# Patient Record
Sex: Female | Born: 1965 | Race: Black or African American | Hispanic: No | Marital: Single | Smoking: Never smoker
Health system: Southern US, Community
[De-identification: ages and names within clinical notes are randomized; demographics above are authoritative.]

## PROBLEM LIST (undated history)

## (undated) DIAGNOSIS — J9819 Other pulmonary collapse: Secondary | ICD-10-CM

## (undated) DIAGNOSIS — C539 Malignant neoplasm of cervix uteri, unspecified: Secondary | ICD-10-CM

## (undated) DIAGNOSIS — K219 Gastro-esophageal reflux disease without esophagitis: Secondary | ICD-10-CM

## (undated) DIAGNOSIS — D869 Sarcoidosis, unspecified: Secondary | ICD-10-CM

## (undated) DIAGNOSIS — N200 Calculus of kidney: Secondary | ICD-10-CM

## (undated) DIAGNOSIS — I2699 Other pulmonary embolism without acute cor pulmonale: Secondary | ICD-10-CM

## (undated) HISTORY — DX: Gastro-esophageal reflux disease without esophagitis: K21.9

## (undated) HISTORY — DX: Other pulmonary collapse: J98.19

## (undated) HISTORY — DX: Sarcoidosis, unspecified: D86.9

## (undated) HISTORY — PX: CYSTOSCOPY: SUR368

## (undated) HISTORY — DX: Calculus of kidney: N20.0

## (undated) HISTORY — DX: Other pulmonary embolism without acute cor pulmonale: I26.99

## (undated) HISTORY — DX: Malignant neoplasm of cervix uteri, unspecified: C53.9

---

## 1999-06-11 HISTORY — PX: LYMPHADENECTOMY: SHX15

## 1999-10-12 ENCOUNTER — Encounter (INDEPENDENT_AMBULATORY_CARE_PROVIDER_SITE_OTHER): Payer: Self-pay | Admitting: Specialist

## 1999-10-12 ENCOUNTER — Inpatient Hospital Stay (HOSPITAL_COMMUNITY): Admission: AD | Admit: 1999-10-12 | Discharge: 1999-10-12 | Payer: Self-pay

## 1999-10-15 ENCOUNTER — Ambulatory Visit (HOSPITAL_COMMUNITY): Admission: RE | Admit: 1999-10-15 | Discharge: 1999-10-15 | Payer: Self-pay | Admitting: *Deleted

## 1999-10-16 ENCOUNTER — Encounter: Admission: RE | Admit: 1999-10-16 | Discharge: 1999-10-16 | Payer: Self-pay | Admitting: Obstetrics

## 1999-10-23 ENCOUNTER — Ambulatory Visit (HOSPITAL_COMMUNITY): Admission: RE | Admit: 1999-10-23 | Discharge: 1999-10-23 | Payer: Self-pay | Admitting: *Deleted

## 1999-10-23 ENCOUNTER — Encounter (INDEPENDENT_AMBULATORY_CARE_PROVIDER_SITE_OTHER): Payer: Self-pay

## 1999-10-30 ENCOUNTER — Encounter: Admission: RE | Admit: 1999-10-30 | Discharge: 1999-10-30 | Payer: Self-pay | Admitting: Obstetrics & Gynecology

## 1999-11-02 ENCOUNTER — Ambulatory Visit (HOSPITAL_COMMUNITY): Admission: RE | Admit: 1999-11-02 | Discharge: 1999-11-02 | Payer: Self-pay | Admitting: Obstetrics & Gynecology

## 1999-11-21 ENCOUNTER — Ambulatory Visit: Admission: RE | Admit: 1999-11-21 | Discharge: 1999-11-21 | Payer: Self-pay | Admitting: Gynecology

## 1999-11-30 ENCOUNTER — Encounter: Payer: Self-pay | Admitting: Gynecology

## 1999-12-04 ENCOUNTER — Encounter (INDEPENDENT_AMBULATORY_CARE_PROVIDER_SITE_OTHER): Payer: Self-pay

## 1999-12-04 ENCOUNTER — Inpatient Hospital Stay (HOSPITAL_COMMUNITY): Admission: RE | Admit: 1999-12-04 | Discharge: 1999-12-06 | Payer: Self-pay | Admitting: Gynecology

## 1999-12-19 ENCOUNTER — Ambulatory Visit: Admission: RE | Admit: 1999-12-19 | Discharge: 1999-12-19 | Payer: Self-pay | Admitting: Gynecology

## 1999-12-21 ENCOUNTER — Encounter: Admission: RE | Admit: 1999-12-21 | Discharge: 2000-03-20 | Payer: Self-pay | Admitting: Radiation Oncology

## 2000-01-29 ENCOUNTER — Ambulatory Visit: Admission: RE | Admit: 2000-01-29 | Discharge: 2000-01-29 | Payer: Self-pay | Admitting: Gynecologic Oncology

## 2000-02-02 ENCOUNTER — Emergency Department (HOSPITAL_COMMUNITY): Admission: EM | Admit: 2000-02-02 | Discharge: 2000-02-02 | Payer: Self-pay | Admitting: Emergency Medicine

## 2000-02-12 ENCOUNTER — Inpatient Hospital Stay (HOSPITAL_COMMUNITY): Admission: RE | Admit: 2000-02-12 | Discharge: 2000-02-15 | Payer: Self-pay | Admitting: Radiation Oncology

## 2000-05-27 ENCOUNTER — Ambulatory Visit: Admission: RE | Admit: 2000-05-27 | Discharge: 2000-05-27 | Payer: Self-pay | Admitting: Gynecology

## 2000-05-27 ENCOUNTER — Other Ambulatory Visit: Admission: RE | Admit: 2000-05-27 | Discharge: 2000-05-27 | Payer: Self-pay | Admitting: Gynecology

## 2000-09-02 ENCOUNTER — Other Ambulatory Visit: Admission: RE | Admit: 2000-09-02 | Discharge: 2000-09-02 | Payer: Self-pay | Admitting: Radiation Oncology

## 2000-11-18 ENCOUNTER — Other Ambulatory Visit: Admission: RE | Admit: 2000-11-18 | Discharge: 2000-11-18 | Payer: Self-pay | Admitting: Gynecology

## 2000-11-18 ENCOUNTER — Ambulatory Visit: Admission: RE | Admit: 2000-11-18 | Discharge: 2000-11-18 | Payer: Self-pay | Admitting: Gynecology

## 2001-03-10 ENCOUNTER — Other Ambulatory Visit: Admission: RE | Admit: 2001-03-10 | Discharge: 2001-03-10 | Payer: Self-pay | Admitting: Radiation Oncology

## 2001-07-29 ENCOUNTER — Other Ambulatory Visit: Admission: RE | Admit: 2001-07-29 | Discharge: 2001-07-29 | Payer: Self-pay | Admitting: Gynecology

## 2001-07-29 ENCOUNTER — Ambulatory Visit: Admission: RE | Admit: 2001-07-29 | Discharge: 2001-07-29 | Payer: Self-pay | Admitting: Gynecology

## 2001-07-29 ENCOUNTER — Encounter (INDEPENDENT_AMBULATORY_CARE_PROVIDER_SITE_OTHER): Payer: Self-pay

## 2001-10-20 ENCOUNTER — Other Ambulatory Visit: Admission: RE | Admit: 2001-10-20 | Discharge: 2001-10-20 | Payer: Self-pay | Admitting: Radiation Oncology

## 2001-12-01 ENCOUNTER — Emergency Department (HOSPITAL_COMMUNITY): Admission: EM | Admit: 2001-12-01 | Discharge: 2001-12-02 | Payer: Self-pay | Admitting: Emergency Medicine

## 2002-04-26 ENCOUNTER — Encounter: Admission: RE | Admit: 2002-04-26 | Discharge: 2002-04-26 | Payer: Self-pay | Admitting: Gastroenterology

## 2002-04-26 ENCOUNTER — Encounter: Payer: Self-pay | Admitting: Gastroenterology

## 2002-05-04 ENCOUNTER — Other Ambulatory Visit: Admission: RE | Admit: 2002-05-04 | Discharge: 2002-05-04 | Payer: Self-pay | Admitting: Gynecology

## 2002-05-04 ENCOUNTER — Encounter (INDEPENDENT_AMBULATORY_CARE_PROVIDER_SITE_OTHER): Payer: Self-pay | Admitting: *Deleted

## 2002-05-04 ENCOUNTER — Ambulatory Visit: Admission: RE | Admit: 2002-05-04 | Discharge: 2002-05-04 | Payer: Self-pay | Admitting: Gynecology

## 2002-10-19 ENCOUNTER — Emergency Department (HOSPITAL_COMMUNITY): Admission: EM | Admit: 2002-10-19 | Discharge: 2002-10-19 | Payer: Self-pay | Admitting: Emergency Medicine

## 2002-10-19 ENCOUNTER — Encounter: Payer: Self-pay | Admitting: Emergency Medicine

## 2002-11-02 ENCOUNTER — Ambulatory Visit (HOSPITAL_COMMUNITY): Admission: RE | Admit: 2002-11-02 | Discharge: 2002-11-02 | Payer: Self-pay | Admitting: Radiation Oncology

## 2002-11-02 ENCOUNTER — Ambulatory Visit: Admission: RE | Admit: 2002-11-02 | Discharge: 2002-11-02 | Payer: Self-pay | Admitting: Radiation Oncology

## 2002-11-02 ENCOUNTER — Other Ambulatory Visit: Admission: RE | Admit: 2002-11-02 | Discharge: 2002-11-02 | Payer: Self-pay | Admitting: Radiation Oncology

## 2003-03-06 ENCOUNTER — Emergency Department (HOSPITAL_COMMUNITY): Admission: EM | Admit: 2003-03-06 | Discharge: 2003-03-06 | Payer: Self-pay | Admitting: Emergency Medicine

## 2003-04-15 ENCOUNTER — Ambulatory Visit (HOSPITAL_COMMUNITY): Admission: RE | Admit: 2003-04-15 | Discharge: 2003-04-15 | Payer: Self-pay | Admitting: Gastroenterology

## 2003-04-15 ENCOUNTER — Encounter (INDEPENDENT_AMBULATORY_CARE_PROVIDER_SITE_OTHER): Payer: Self-pay | Admitting: *Deleted

## 2003-07-19 ENCOUNTER — Ambulatory Visit: Admission: RE | Admit: 2003-07-19 | Discharge: 2003-07-19 | Payer: Self-pay | Admitting: Gynecology

## 2003-07-19 ENCOUNTER — Other Ambulatory Visit: Admission: RE | Admit: 2003-07-19 | Discharge: 2003-07-19 | Payer: Self-pay | Admitting: Gynecology

## 2003-11-08 ENCOUNTER — Other Ambulatory Visit: Admission: RE | Admit: 2003-11-08 | Discharge: 2003-11-08 | Payer: Self-pay | Admitting: Gynecology

## 2003-11-08 ENCOUNTER — Ambulatory Visit: Admission: RE | Admit: 2003-11-08 | Discharge: 2003-11-08 | Payer: Self-pay | Admitting: Gynecology

## 2003-11-08 ENCOUNTER — Ambulatory Visit: Admission: RE | Admit: 2003-11-08 | Discharge: 2003-11-08 | Payer: Self-pay | Admitting: Radiation Oncology

## 2003-11-08 ENCOUNTER — Encounter (INDEPENDENT_AMBULATORY_CARE_PROVIDER_SITE_OTHER): Payer: Self-pay | Admitting: Specialist

## 2004-06-10 HISTORY — PX: NASAL SINUS SURGERY: SHX719

## 2004-06-11 ENCOUNTER — Emergency Department (HOSPITAL_COMMUNITY): Admission: EM | Admit: 2004-06-11 | Discharge: 2004-06-12 | Payer: Self-pay | Admitting: Emergency Medicine

## 2004-08-03 ENCOUNTER — Ambulatory Visit (HOSPITAL_COMMUNITY): Admission: RE | Admit: 2004-08-03 | Discharge: 2004-08-03 | Payer: Self-pay | Admitting: *Deleted

## 2004-08-03 ENCOUNTER — Ambulatory Visit (HOSPITAL_BASED_OUTPATIENT_CLINIC_OR_DEPARTMENT_OTHER): Admission: RE | Admit: 2004-08-03 | Discharge: 2004-08-03 | Payer: Self-pay | Admitting: *Deleted

## 2004-08-03 ENCOUNTER — Encounter (INDEPENDENT_AMBULATORY_CARE_PROVIDER_SITE_OTHER): Payer: Self-pay | Admitting: *Deleted

## 2004-08-10 ENCOUNTER — Other Ambulatory Visit: Admission: RE | Admit: 2004-08-10 | Discharge: 2004-08-10 | Payer: Self-pay | Admitting: Gynecology

## 2004-08-10 ENCOUNTER — Ambulatory Visit: Admission: RE | Admit: 2004-08-10 | Discharge: 2004-08-10 | Payer: Self-pay | Admitting: Gynecology

## 2004-08-28 ENCOUNTER — Ambulatory Visit (HOSPITAL_COMMUNITY): Admission: RE | Admit: 2004-08-28 | Discharge: 2004-08-28 | Payer: Self-pay | Admitting: Gynecology

## 2005-10-23 ENCOUNTER — Ambulatory Visit: Payer: Self-pay | Admitting: *Deleted

## 2006-05-27 ENCOUNTER — Encounter: Admission: RE | Admit: 2006-05-27 | Discharge: 2006-05-27 | Payer: Self-pay | Admitting: Gastroenterology

## 2006-06-14 ENCOUNTER — Emergency Department (HOSPITAL_COMMUNITY): Admission: EM | Admit: 2006-06-14 | Discharge: 2006-06-15 | Payer: Self-pay | Admitting: Emergency Medicine

## 2006-06-19 ENCOUNTER — Encounter: Admission: RE | Admit: 2006-06-19 | Discharge: 2006-06-19 | Payer: Self-pay | Admitting: Internal Medicine

## 2006-06-23 ENCOUNTER — Encounter: Admission: RE | Admit: 2006-06-23 | Discharge: 2006-06-23 | Payer: Self-pay | Admitting: Family Medicine

## 2006-08-19 ENCOUNTER — Ambulatory Visit (HOSPITAL_COMMUNITY): Admission: RE | Admit: 2006-08-19 | Discharge: 2006-08-19 | Payer: Self-pay | Admitting: Gastroenterology

## 2006-08-19 ENCOUNTER — Encounter (INDEPENDENT_AMBULATORY_CARE_PROVIDER_SITE_OTHER): Payer: Self-pay | Admitting: Specialist

## 2006-09-09 ENCOUNTER — Emergency Department (HOSPITAL_COMMUNITY): Admission: EM | Admit: 2006-09-09 | Discharge: 2006-09-09 | Payer: Self-pay | Admitting: Emergency Medicine

## 2007-12-29 ENCOUNTER — Ambulatory Visit (HOSPITAL_COMMUNITY): Admission: RE | Admit: 2007-12-29 | Discharge: 2007-12-29 | Payer: Self-pay | Admitting: Family Medicine

## 2008-09-14 ENCOUNTER — Ambulatory Visit (HOSPITAL_COMMUNITY): Admission: RE | Admit: 2008-09-14 | Discharge: 2008-09-14 | Payer: Self-pay | Admitting: Obstetrics & Gynecology

## 2008-12-30 ENCOUNTER — Ambulatory Visit (HOSPITAL_COMMUNITY): Admission: RE | Admit: 2008-12-30 | Discharge: 2008-12-30 | Payer: Self-pay | Admitting: Family Medicine

## 2010-01-05 ENCOUNTER — Ambulatory Visit (HOSPITAL_COMMUNITY): Admission: RE | Admit: 2010-01-05 | Discharge: 2010-01-05 | Payer: Self-pay | Admitting: Obstetrics & Gynecology

## 2010-04-19 ENCOUNTER — Observation Stay (HOSPITAL_COMMUNITY): Admission: EM | Admit: 2010-04-19 | Discharge: 2010-04-19 | Payer: Self-pay | Admitting: Emergency Medicine

## 2010-04-19 ENCOUNTER — Encounter (INDEPENDENT_AMBULATORY_CARE_PROVIDER_SITE_OTHER): Payer: Self-pay | Admitting: Emergency Medicine

## 2010-06-10 DIAGNOSIS — J9819 Other pulmonary collapse: Secondary | ICD-10-CM

## 2010-06-10 HISTORY — DX: Other pulmonary collapse: J98.19

## 2010-07-01 ENCOUNTER — Encounter: Payer: Self-pay | Admitting: Gynecology

## 2010-08-21 LAB — POCT I-STAT, CHEM 8
Calcium, Ion: 1.27 mmol/L (ref 1.12–1.32)
Chloride: 102 mEq/L (ref 96–112)
Hemoglobin: 11.9 g/dL — ABNORMAL LOW (ref 12.0–15.0)
Sodium: 137 mEq/L (ref 135–145)

## 2010-10-26 NOTE — Op Note (Signed)
NAME:  [name] [name], JENTSCH                      ACCOUNT NO.:  [id number]   MEDICAL RECORD NO.:  [id number]                   PATIENT TYPE:  AMB   LOCATION:  ENDO                                 FACILITY:  [facility]   PHYSICIAN:  Petra Kuba, M.D.                 DATE OF BIRTH:  1965/10/25   DATE OF PROCEDURE:  04/15/2003  DATE OF DISCHARGE:                                 OPERATIVE REPORT   PROCEDURE:  Colonoscopy with biopsy and ERBE argon tissue plasma coagulator.   INDICATIONS FOR PROCEDURE:  Bright red blood per rectum and mucus in a  patient status post cervical cancer with radiation implants, longstanding.  Consent was signed  after the risks and benefits, methods and options were  thoroughly discussed in the office.   MEDICATIONS:  Demerol 30, Versed 3.   DESCRIPTION OF PROCEDURE:  A rectal inspection was pertinent for small  external hemorrhoids. The digital examination  was negative. A video  pediatric adjustable colonoscope was inserted and some mild radiation  proctitis was confirmed with the customary telangiectasias.  Photodocumentation was obtained.   With abdominal  pressure we were able to advance around the colon to the  cecum. This did not require any position changes. No other  obvious  abnormality was seen. We readvanced to the cecum. The cecum was identified  by the appendiceal orifice and ileocecal valve; in fact the scope was  inserted a short way into the terminal ileum which was normal.  Photodocumentation was obtained.   The scope was slowly withdrawn. She did have a tortuous left side of the  colon, but on slow withdrawal through the colon, no abnormalities were seen  until we withdrew back to the rectum. In the more proximal rectum away from  the radiation changes was a tiny polyp  which was cold biopsied x1. The  radiation distal  proctitis was evaluated and it was washed and watched, and  based on her persistent bleeding, we went ahead and very  carefully and only  minimally used the ERBE argon tissue  plasma coagulator with good  coagulation of the telangiectatic areas.   Prior to doing this we tried a few times to retroflex, but based on the  scarring and fixation from the radiation, it could not easily be done, so  the scope was not retroflexed. Anorectal pullthrough did not reveal any  additional findings but maybe some small  hemorrhoids.   Once the ERBE argon tissue plasma coagulator was done, the scope was  reinserted a short way up the left side  of the colon. Air was suctioned and  the scope was removed. The patient tolerated the procedure well. There was  no obvious immediate complication.   ENDOSCOPIC DIAGNOSES:  1. Minimal radiation proctitis, status post  ERBE argon tissue  plasma     coagulator, minimally used.  2. Tiny rectal proximal polyp, not near the radiation area, status  post     cold biopsy.  3. Otherwise  within normal limits to the terminal ileum.    PLAN:  Await pathology to determine further  colonic screening. Follow up in  2 months or p.r.n. to recheck symptoms and make sure no further  workup  plans are needed or more aggressive cauterization in the future.                                               Petra Kuba, M.D.    MEM/MEDQ  D:  04/15/2003  T:  04/15/2003  Job:  [id number]   cc:   De Blanch, M.D.   Billie Lade, M.D.  [address] - [facility]  [place]  [place]  [address]  Fax: [phone]

## 2010-10-26 NOTE — Discharge Summary (Signed)
[facility]  Patient:    [name] [name], [name] [name]                   MRN: [id number] Adm. Date:  [id number] Disc. Date: [id number] Attending:  Jeannette Corpus CC:         Billie Lade, M.D.             Telford Nab, N.P.             Bing Neighbors. Clearance Coots, M.D.                           Discharge Summary  HOSPITAL COURSE:  The patient was taken to the operating room following admission where she was explored for consideration of a radical hysterectomy. Unfortunately she was found to have a grossly enlarged right obturator lymph node which was extensively replaced by metastatic carcinoma.  The radical hysterectomy was aborted, and pelvic and periaortic lymphadenectomy was performed, in order to allow for complete surgical staging.  Postoperatively the patient did well.  She had a rapid advanced in her diet, and had good pain control on oral pain medication.  Her postoperative hematocrit was 32.6%.  DISPOSITION:  The patient was discharged home on December 06, 1999, to return to see Dr. Reuel Boom L. Clarke-Pearson for staple removal on December 10, 1999.  To return on uly 11, 2001, for treatment planning.  At this juncture we anticipate treatment with radiation therapy and concurrent weekly cisplatin chemotherapy.  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  DISCHARGE INSTRUCTIONS:  The patient can gradually return to normal activity. he should eat a regular diet.  She was given a prescription for Vicodin ES one q.4-6h. p.r.n. pain.  She may use a gentle laxative if she needs to.  FOLLOWUP:  She will return to see Dr. Reuel Boom L. Clarke-Pearson on December 19, 1999, for treatment planning.  CONDITION ON DISCHARGE:  Improved. DD:  12/11/99 TD:  12/11/99 Job: [id number]

## 2010-10-26 NOTE — Op Note (Signed)
NAMEMarland Kitchen  CHARNISE, LOVAN            ACCOUNT NO.:  [id number]   MEDICAL RECORD NO.:  [id number]          PATIENT TYPE:  AMB   LOCATION:  ENDO                         FACILITY:  [facility]   PHYSICIAN:  Petra Kuba, M.D.    DATE OF BIRTH:  11-14-1965   DATE OF PROCEDURE:  08/19/2006  DATE OF DISCHARGE:  08/19/2006                               OPERATIVE REPORT   PROCEDURE:  Colonoscopy.   INDICATION:  Change of bowel habit, increased constipation, bright red  blood per rectum.  Consent was signed after risks, benefits, methods,  and options thoroughly discussed multiple times in the past.   MEDICINES USED:  Fentanyl 50 mcg, Versed 9 mg.   PROCEDURE:  Rectal inspections pertinent for small external hemorrhoids.  Digital exam was negative.  The video pediatric adjustable colonoscopy  was inserted.  There was some difficulty due to a long looping tortuous  colon.  With rolling her on her back and various abdominal pressures,  was able to be advanced to the cecum.  Other than some minimal radiation  proctitis, no abnormalities were seen on insertion.  The scope was  inserted a short way into the terminal ileum which was normal.  Photo  documentation was obtained.  The cecum was identified by the appendiceal  orifice and ileocecal valve.  The scope was slowly withdrawn.  Prep was  adequate.  There was some liquid stool that required washing and  suctioning.  No abnormalities were seen as we slowly withdrew back to  the rectum.  Once back in the rectum, anorectal pull through and  retroflexion confirmed the minimal radiation proctitis without any other  lesions.  The scope was inserted a short way up the left side of the  colon.  Air was suctioned.  The scope removed.  The patient tolerated  the procedure well.  There was no obvious immediate complication  endoscopic.   DIAGNOSES:  1. Internal and external small hemorrhoids.  2. Minimal radiation proctitis.  3. Otherwise within normal  limits to the cecum and terminal ileum.   PLAN:  Continue __________ suppository since that is clearly helping.  Maybe try Amitiza next for constipation.  Continue workup with  endoscopy.  Please see that dictation for the recommendation, workup and  plan.           ______________________________  Petra Kuba, M.D.     MEM/MEDQ  D:  08/19/2006  T:  08/21/2006  Job:  [id number]   cc:   Tanya D. Daphine Deutscher, M.D.  De Blanch, M.D.

## 2010-10-26 NOTE — Discharge Summary (Signed)
[facility]  Patient:    [name], [name]                   MRN: [id number] Adm. Date:  [id number] Disc. Date: [id number] Attending:  Jeneen Montgomery CC:         Rande Brunt. Clarke-Pearson, M.D.  Valentino Hue. Magrinat, M.D.  Roseanna Rainbow, M.D.   Discharge Summary  DISCHARGE DIAGNOSIS:  Stage IB squamous cell carcinoma of the cervix with positive pelvic nodes at exploratory laparotomy.  CHIEF COMPLAINT:  Radiation treatment with intracavitary radiation using cesium-137 radiation sources.  DISCHARGE MEDICATIONS:  Tylenol #3, dispense #25, one q.6h. p.r.n. pain p.o.  DISCHARGE INSTRUCTIONS:  Call for a temperature of greater than 101.5 degrees, or for severe vaginal bleeding.  FOLLOWUP:  In radiation oncology in one month.  HOSPITAL COURSE:  [name] [name] is a 45 year old female who was diagnosed with clinical stage IB1 squamous cell carcinoma of the cervix.  She decided on a radical hysterectomy as her definitive treatment.  At the time of her exploratory laparotomy, the patient was noted to have enlarged pelvic nodes, which were biopsied and returned as squamous cell carcinoma.  The patient proceeded to undergo pelvic and periaortic lymphadenectomy.  In light of the positive nodes, the patients radical hysterectomy was aborted.  She was subsequently referred to radiation oncology, and recently completed 4500 cGy to the pelvis area.  On February 12, 2000, the patient was taken to the operating room and on examination under anesthesia the patient was noted to have significant improvement in her cervical lesion.  The tumor mass had decreased in size to approximately 2.0 cm x 2.5 cm in size.  She subsequently had her Fletcher-Suit applicator placed under anesthesia.  On the afternoon of February 12, 2000, the patient had loading of her Fletcher-Suit applicator, with 5, 15, 10 mg sources in the tandem.  The ovoids were loaded each with  10 mg sources.  The implant duration was for 63 hours, which began on Tuesday, February 12, 2000, at 3:45 p.m.  The radiation sources were subsequently removed at 6:45 a.m. on February 15, 2000.  The radiation survey was completed, showing no retained sources within the patient, hospital bed, or room.  DISPOSITION:  The patient was subsequently discharged home later in the morning, after her Betadine douche and Fleets enema.  FOLLOWUP:  She is to return for followup in radiation/oncology in one month. DD:  02/15/00 TD:  02/15/00 Job: [id number]

## 2010-10-26 NOTE — Op Note (Signed)
NAME:  [name] [name], [name] [name]            ACCOUNT NO.:  [id number]   MEDICAL RECORD NO.:  [id number]          PATIENT TYPE:  AMB   LOCATION:  DSC                          FACILITY:  [facility]   PHYSICIAN:  Kathy Breach, M.D.      DATE OF BIRTH:  05-Jan-1966   DATE OF PROCEDURE:  08/03/2004  DATE OF DISCHARGE:                                 OPERATIVE REPORT   PREOPERATIVE DIAGNOSIS:  Obstructive, hyperplastic inferior nasal  turbinates.   OPERATIVE PROCEDURE:  Bilateral submucous resection, inferior turbinates.   POSTOPERATIVE DIAGNOSIS:  Obstructive, hyperplastic inferior nasal  turbinates.   DESCRIPTION OF PROCEDURE:  With the patient under general orotracheal  anesthesia, nasal inspection revealed obstructive, congested inferior  turbinates bilaterally.  Nasal block anesthesia was applied with olive-  tipped probe soaked in 4% Xylocaine/ephedrine solution to the sphenopalatine  and anterior ethmoid nerve areas bilaterally.  Cotton pledgets inserted  along the inferior turbinates soaked in similar solution.  Both inferior  turbinates were then infiltrated with 1% Xylocaine and 1:100,000 epinephrine  for further vasoconstrictive effort.  The patient's nose was decongested  nicely.  Large, prominent, heavy turbinate bones evident to inspection after  decongesting.  A stab incision was made over the anterior aspect of the left  inferior turbinate.  Superiorly-based septal-facing mucosal flap elevated  and incision made along the inferior aspect.  The lower 50% or so of the  turbinate bone was attached free margin, and the inferior meatal mucosa was  excised with angled scissors.  Hemostasis along the mucosal and bone line  obtained with suction cautery.  The remaining turbinate bone was gently  outfractured.  A similar procedure was done on the right side.  On the left  side, the posterior extent was ablated primarily with suction cautery.  On  the right side it was excised.  Blood loss for the  procedure was less than  50 mL.  The patient tolerated the procedure well, was taken to the recovery  room in stable general condition.      JGL/MEDQ  D:  08/03/2004  T:  08/03/2004  Job:  [id number]

## 2010-10-26 NOTE — Consult Note (Signed)
NAME:  [name] [name], [name] [name]            ACCOUNT NO.:  [id number]   MEDICAL RECORD NO.:  [id number]          PATIENT TYPE:  OUT   LOCATION:  GYN                          [facility]   PHYSICIAN:  De Blanch, M.D.DATE OF BIRTH:  [birth year]   DATE OF CONSULTATION:  08/10/2004  DATE OF DISCHARGE:                                   CONSULTATION   A 45 year old African-American female returns for continuing followup of  cervical cancer.   INTERVAL HISTORY:  Since her last visit, the patient has done well. She  denies any GI or GU symptoms and has no pelvic pain, pressure, vaginal bleed  or discharge. She recently underwent sinus surgery and seems to be  recovering well from that.   HISTORY OF PRESENT ILLNESS:  The patient is found to have a stage 1B  squamous cell carcinoma of the cervix in June of 2001. She underwent  exploratory laparotomy in consideration for radical hysterectomy which was  aborted because she had bilaterally enlarged pelvic lymph nodes. She was  subsequently treated with radiation therapy and concurrent cisplatin  chemotherapy. She had a complete response and has been followed since then  with no evidence of recurrent disease.   PAST MEDICAL HISTORY:  Medical illnesses none.   ALLERGIES:  None.   PAST SURGICAL HISTORY:  Exploratory laparotomy.   FAMILY HISTORY:  Negative for gynecologic, breast or colon cancer.   SOCIAL HISTORY:  The patient does not smoke.   REVIEW OF SYMPTOMS:  Negative except as noted above.   PHYSICAL EXAMINATION:  VITAL SIGNS:  Weight 135 pounds, blood pressure  120/80.  GENERAL:  The patient is a healthy black female in no acute distress.  HEENT:  Negative.  NECK:  Supple without thyromegaly. There is no supraclavicular or inguinal  adenopathy.  ABDOMEN:  Soft, nontender, no mass, organomegaly, ascites or hernias are  noted.  PELVIC:  EGBUS, vagina, bladder, urethra are normal and well supported. No  lesions are noted. The  cervix and uterus are not present. Bimanual reveals  no masses, induration or nodularity. Rectovaginal exam confirms.   IMPRESSION:  Stage 1B squamous cell carcinoma of the cervix with bilateral  metastatic disease in the pelvis status post radiation therapy and  concurrent chemotherapy. No evidence of recurrent disease. Pap smears are  repeated. The patient will have a baseline mammogram in the near future. She  will return to see [place] in one year for continuing surveillance.      DC/MEDQ  D:  08/10/2004  T:  08/10/2004  Job:  [id number]   cc:   Billie Lade, M.D.  [address] - [facility]  [place]  [place] [address]  Fax: [phone]   Valentino Hue. Magrinat, M.D.  [address] [facility]  [place]  [place] [address]  Fax: [phone]   Maryla Morrow. Modesto Charon, M.D.  [address]  [place]  [place] [address]  Fax: [phone]   Telford Nab, R.N.  [phone]  [place], [place] [address]

## 2010-10-26 NOTE — Op Note (Signed)
[facility]  Patient:    [name] [name], [name] [name]                   MRN: [id number] Proc. Date: 12/04/99 Adm. Date:  [id number] Disc. Date: [id number] Attending:  Jeannette Corpus CC:         Bing Neighbors. Clearance Coots, M.D.             Telford Nab, N.P.             Roseanna Rainbow, M.D.                           Operative Report  PREOPERATIVE DIAGNOSIS:  Stage IB1 squamous cell carcinoma of the cervix.  POSTOPERATIVE DIAGNOSIS:  Stage IB1 squamous cell carcinoma of the cervix, with  metastasis to the right obturator lymph node.  SURGICAL PROCEDURE:  Exploratory laparotomy, pelvic lymphadenectomy, periaortic  lymphadenectomy, transposition of the right ovary.  SURGEON:  Daniel L. Clarke-Pearson, M.D.  ASSISTANT:  Bing Neighbors. Clearance Coots, M.D. and Telford Nab, N.P.  ANESTHESIA:  General with orotracheal tube.  ESTIMATED BLOOD LOSS:  300 cc.  SURGICAL FINDINGS:  At exploratory laparotomy there was a 5.0 cm x 6.0 cm right  obturator lymph node which was wrapped around the right obturator nerve.  This as completely resected.  There were no other suspicious lymph nodes in the external iliac or other side of the pelvis.  The periaortic lymph nodes were likewise normal in size.  There was no evidence of any intraperitoneal disease.  The parametria  felt free of any disease.  DESCRIPTION OF PROCEDURE:  The patient was brought to the operating room and after satisfactory attainment of general anesthesia, was placed in the modified lithotomy position in the [place] stirrups.  The anterior abdominal wall, perineum, and vagina were prepped with Betadine.  A Foley catheter was placed and the patient was draped.  The abdomen was entered through a Pfannenstiel incision, and the abdomen and pelvis explored with the above-noted findings.  The Bookwalter retractor was assembled and the small bowel was packed out of the pelvis.  The right round ligament  was divided and the retroperitoneal space opened.  The paravesical and  pararectal spaces were opened.  At this juncture the large obturator lymph node was discovered and it was determined that this should be resected as the first step of the operation.  Using sharp and blunt dissection, the obturator node was gently and carefully mobilized from the undersurface of the right external iliac vein.  Once this was accomplished, the obturator fossa was developed further.  The node was stuck to the obturator internus muscle, but this was freed with sharp and blunt dissection. The obturator nerve was identified distally and using sharp and blunt dissection, was mobilized cephalad.  Approximately halfway up the obturator nerve, it was found to be encased in the tumor mass.  The hypogastric vein was identified and with careful dissection, dissected free from the mass. The mass was further dissected from the pelvic floor and obturator internus muscle.  Finally the mass was dissected free from the obturator nerve and submitted to pathology.  Frozen section revealed this to be a large metastatic deposit in the lymph node.  The left side of the pelvis was opened, identifying the paravesical and pararectal spaces.  Palpation of the Cardinal ligaments and side wall revealed no evidence of grossly enlarged lymph nodes.  The lymph nodes were removed from the obturator  space on the left side.  Hemostasis achieved with hemoclips.  Attention was turned to the aortic chain, in order to stage the patient more properly.  A peritoneal incision was made overlying the right common iliac artery and along the aorta.  The lymph nodes over the right common iliac and aorta and  vena cava were resected, using hemoclips for hemostasis.  These were sent to frozen section and returned as negative.  The right ovary and tube were detached from their uterine-ovarian anastomosis and mobilized into the right  pericolic gutter lateral to the cecum.  The pedicle was transfixed and suture ligated using #2-0 Vicryl.  The pedicle was then marked with two large hemoclips.  The ovary was sutured to the right pericolic gutter in order to mobilize it away from the radiation field.  The pelvis was reinspected and irrigated.  Hemostasis was adequate.  It is felt at this juncture that the patient would be best served by giving a combination of radiation therapy and chemotherapy, and that a radical hysterectomy would only add to increased morbidity, and also  would eliminate the opportunity for intercavity cesium application.  Therefore he radical hysterectomy portion of the operation was abandoned.  The packs and retractors were removed.  The anterior abdominal wall was closed in layers, the  first being a running suture of #2-0 Vicryl on the peritoneum.  The fascia was closed with #0 PDS.  The subcutaneous tissue was irrigated and hemostasis achieved with cautery, and the skin closed with skin staples.  The patient was awakened from anesthesia and taken to the recovery room after a  dressing was applied.  The sponge, needle, and instrument counts were correct x 2.DD:  12/04/99 TD:  12/04/99 Job: [id number]

## 2010-10-26 NOTE — Consult Note (Signed)
[facility]  Patient:    [name] [name], [name] [name] Visit Number: [id number] MRN: [id number]          Service Type: GON Location: GYN Attending Physician:  Jeannette Corpus Dictated by:   Rande Brunt. Clarke-Pearson, M.D.   CC:         Billie Lade, M.D.  Valentino Hue. Magrinat, M.D.  Redmond Baseman, M.D.  Telford Nab, R.N.   Consultation Report  REASON FOR CONSULTATION:  This is a 45 year old African-American female who returns for continuing followup. She had a clinical stage IB cervical carcinoma and was explored for radial hysterectomy. She had bilaterally positive pelvic nodes but negative periaortic nodes. The radial hysterectomy was aborted, and the patient underwent primary radiation therapy with intracavitary cesium application combined with weekly cisplatin chemotherapy. Since her last visit, she has done very well. She is working full time as a Investment banker, operational at [employer]. She denies any GI or GU symptoms; has no pelvic pain, pressure, vaginal bleeding, or discharge.  REVIEW OF SYSTEMS:  Negative except as noted above.  FAMILY HISTORY/SOCIAL HISTORY:  Reviewed, and the patient does not smoke.  PHYSICAL EXAMINATION:  VITAL SIGNS:  Weight 130 pounds. Blood pressure 110/68.  GENERAL:  The patient is a healthy African-American female in no acute distress.  HEENT:  Negative.  NECK:  Supple without thyromegaly. There is no supraclavicular or inguinal adenopathy.  ABDOMEN:  Soft, nontender. No masses, organomegaly, ascites, or hernias are noted. Her incision is well-healed.  PELVIC:  EG/BUS normal. Vagina is clean, well-supported. No lesions are noted.  BIMANUAL RECTOVAGINAL:  Reveal no masses, induration, or nodularity.  EXTREMITIES:  Lower extremities are without edema or varicosities.  IMPRESSION:  Stage IB squamous cell carcinoma of the cervix with bilateral positive pelvic nodes status post radiation therapy with  concurrent cisplatin chemotherapy. She remains clinically free of disease.  PLAN:  Pap smears are obtained today. The patient will return to see Dr. Roselind Messier in 3 months and return to see [place] in September 2003. Dictated by:   Rande Brunt. Clarke-Pearson, M.D. Attending Physician:  Jeannette Corpus DD:  07/29/01 TD:  07/30/01 Job: [id number]

## 2010-10-26 NOTE — Op Note (Signed)
NAMEMarland Kitchen  [name] [name], [name] [name]            ACCOUNT NO.:  [id number]   MEDICAL RECORD NO.:  [id number]          PATIENT TYPE:  AMB   LOCATION:  ENDO                         FACILITY:  [facility]   PHYSICIAN:  Petra Kuba, M.D.    DATE OF BIRTH:  July 03, 1965   DATE OF PROCEDURE:  08/19/2006  DATE OF DISCHARGE:  08/19/2006                               OPERATIVE REPORT   PROCEDURE:  Esophagogastroduodenoscopy with biopsy.   INDICATION:  The patient with weight loss, decreased appetite.   Consent was signed after risks, benefits, methods, options thoroughly  discussed in the office and prior to any premeds given.  Additional  medicines for this procedure is 1 of Versed only.   PROCEDURE:  The video endoscope was inserted by direct vision.  The  esophagus was normal.  There may have been a tiny hiatal hernia.  The  scope passed into the stomach and advanced to the antrum and advanced  through a normal pylorus.  There was minimal inflammation in the  duodenal bulb and around the celiac to a normal second portion of the  duodenum.  The scope was withdrawn back to the bulb and a good look  there ruled out abnormality and location.  The scope was withdrawn back  to the stomach and retroflexed.  Along the angularis and along the  distal greater curve was some increased inflammation and a few tiny  erosions.  The cardia, fundus, lesser and greater curve proximally were  normal on retroflexed visualization.  Straight visualization of the  stomach did not reveal any additional findings but the gastritis.  We  went ahead and took a few biopsies of the inflammation and a few of the  erosions, and a few of the proximal stomach, as well, to rule out any  Helicobacter.  Air was suctioned.  The scope slowly withdrawn.  Again,  no other abnormalities were seen on straight visualization of the  stomach.  On slow withdrawal, a good look at the esophagus was normal.  The scope was removed.  The patient tolerated the  procedure well.  There  was no obvious immediate complication.   ENDOSCOPIC DIAGNOSES:  1. Questionable tiny hiatal hernia.  2. Gastritis and few distal greater curve and angularis erosions,      status post biopsy.  3. Otherwise normal esophagogastroduodenoscopy except for some minimal      bulbitis.   PLAN:  Care with aspirate and nonsteroidals.  Await pathology.  Prilosec  OTC one time a day.  Followup in one month to recheck symptoms and  decide any other workup and plans.  Will call and check symptoms when we  review biopsies and maybe give her samples of Amitiza at that juncture.           ______________________________  Petra Kuba, M.D.     MEM/MEDQ  D:  08/19/2006  T:  08/21/2006  Job:  [id number]   cc:   Tanya D. Daphine Deutscher, M.D.  De Blanch, M.D.

## 2010-10-26 NOTE — Consult Note (Signed)
[facility]  Patient:    [name] [name], [name] [name]                   MRN: [id number] Proc. Date: 01/29/00 Adm. Date:  [id number] Disc. Date: [id number] Attending:  Jeannette Corpus CC:         Redmond Baseman, M.D. - Valentino Hue. Magrinat, M.D.             Billie Lade, M.D. - Roseanna Rainbow, M.D.             Telford Nab, R.N.                          Consultation Report  CHIEF COMPLAINT:  Ms. Climer returns for ongoing followup during chemotherapy, irradiation for stage IB cancer of the cervix, with lymph node metastasis.  HISTORY OF PRESENT ILLNESS:  The patient had a prior diagnosis of cervical dysplasia treated with cryotherapy in 1999.  She had a biopsy on Oct 12, 1999, revealing squamous cell carcinoma with focal invasion.  A cone biopsy revealed n invasive moderately-differentiated squamous cell carcinoma with LVI.  Margins were positive.  A CT scan of the abdomen and pelvis in late May revealed low attenuation bulky cervix, but otherwise negative CT scans and chest x-rays.  She was explored on December 04, 1999, for consideration of a radical hysterectomy.  She was found to have a 6.0 cm obturator lymph node with transposition of the ovary and resection of the enlarged lymph node, bilateral pelvic lymphadenectomy, and periaortic sampling. She had negative periaortic nodes, but bilateral pelvic nodal involvement.  She has been receiving chemotherapy and irradiation with single agent cisplatin and radiation.  She relates a diminished appetite and constipation during treatment. She relates minimal symptoms of nausea and some fatigue.  External beam radiation will complete on February 06, 2000, and she is planned to have at least one cesium application post-external beam radiotherapy.  PAST MEDICAL HISTORY:  Significant for peptic ulcer disease.  PAST SURGICAL HISTORY: 1. Exploratory laparotomy with lymphadenectomy as  above. 2. Wisdom tooth removal.  FAMILY HISTORY:  No history of breast, gynecologic, or colon cancer.  PERSONAL/SOCIAL HISTORY:  Denies tobacco or ethanol use.  CURRENT MEDICATIONS: 1. Antiemetics p.r.n. 2. Weekly cisplatin.  ALLERGIES:  No known drug allergies.  REVIEW OF SYSTEMS:  The patient denies fevers, chills, but has noted weight loss during chemo/irradiation.  She denies cardiopulmonary, GI, or GU symptoms other  than constipation.  She denies vaginal bleeding or discharge since her surgery.  She denies back pain or leg swelling.  She denies symptoms of neuropathy from cisplatin chemotherapy.  PHYSICAL EXAMINATION:  VITAL SIGNS:  Weight 120 pounds (decreased 12 pounds since June).  Vital signs stable.  GENERAL:  The patient is alert and oriented x 3, in no acute distress.  HEENT:  Benign.  NODES:  There is no pathologic lymphadenopathy.  ABDOMEN:  The abdominal incision is well-healed, without hernia.  The abdomen is soft and benign, without tenderness, mass, or organomegaly.  BACK:  There is no back or CVA tenderness.  NODES:  No lymphedema.  GENITOURINARY/PELVIC:  External genitalia and BUS normal to inspection and palpation.  The bladder and urethra are well-supported.  The cervix has patchy erythema but no gross tumor.  BIMANUAL/RECTOVAGINAL:  Examinations reveal a soft cervix approximately 3.5 cm n diameter.  Uterus is small.  There is no adnexal or parametrial abnormality.  ASSESSMENT: 1. 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 2. Constipation.  PLAN:  I have recommended that she continue Senokot q.i.d.  We discussed the use of nutritional supplements, but I would expect her appetite to return when she has  completed chemotherapy, irradiation.  She will return to see GYN/Oncology in approximately three months. DD:  01/29/00 TD:  01/29/00 Job: [id number]

## 2010-10-26 NOTE — Consult Note (Signed)
[facility]  Patient:    STEWART, PIMENTA                   MRN: [id number] Proc. Date: 05/27/00 Adm. Date:  [id number] Disc. Date: [id number] Attending:  Jeannette Corpus CC:         Billie Lade, M.D.  Valentino Hue. Magrinat, M.D.  Telford Nab, R.N.  Redmond Baseman, M.D.  Roseanna Rainbow, M.D.   Consultation Report  HISTORY OF PRESENT ILLNESS:  The patient is a 45 year old African-American female who had a stage IB squamous cell carcinoma of the cervix.  At the time exploration for consideration of radical hysterectomy on June 26, she was found to have bilaterally positive nodes in the pelvis with negative para-aortic nodes.  She did not have a radical hysterectomy, but underwent primary radiation therapy using external beam and intracavitary cesium application combined with weekly cisplatin chemotherapy.  She tolerated the therapy well.  Since completing therapy, she has had no problems.  She denies any GI or GU problems.  Her appetite is good.  She has no pelvic pain/pressure, vaginal bleeding or discharge.  She is using a vaginal dilator, but no vaginal cream or estrogens.  She is not having any hot flashes.  In general, the patient is doing nicely.  REVIEW OF SYSTEMS:  Negative.  FAMILY HISTORY AND SOCIAL HISTORY:  Reviewed and unchanged from previous notations.  PHYSICAL EXAMINATION:  VITAL SIGNS:  Weight 118 pounds (stable).  HEENT:  Negative.  NECK:  Supple without thyromegaly.  LYMPHATICS:  There is no supraclavicular, axillary, or inguinal adenopathy.  ABDOMEN:  Soft, nontender.  No mass, organomegaly, ascites, or hernias are noted.  There is some thickening of the left lateral aspect of her transverse incision.  PELVIC:  EGBUS normal.  Vagina is atrophic, clean, and agglutinated at the apex.  The cervix cannot be visualized.  Bimanual exam reveals a normal size cervix and uterus.  No adnexal masses  noted.  I did not feel and side wall masses or nodularity.  IMPRESSION:  Stage IB squamous cell carcinoma of the cervix with pelvic lymph nodes metastases, status post definitive radiation therapy.  No evidence of recurrent disease.  PLAN:  Pap smears are obtained.  The patient will be given a prescription for Premarin vaginal cream to use twice a week.  She continues her dilator.  She will return to see Dr. Roselind Messier in three months and return to see me in six months. DD:  05/27/00 TD:  05/28/00 Job: [id number]

## 2010-10-26 NOTE — Group Therapy Note (Signed)
NAME:  [name] [name], [name] [name] NO.:  [id number]   MEDICAL RECORD NO.:  [id number]          PATIENT TYPE:  WOC   LOCATION:  [facility]                   FACILITY:  [facility]   PHYSICIAN:  Ellis Parents, MD    DATE OF BIRTH:  1965/09/25   DATE OF SERVICE:  10/23/2005                                    CLINIC NOTE   This 45 year old patient had a pelvic and periaortic lymphadenectomy for  stage IB-1 squamous cell carcinoma of the cervix with metastasis to the  right obturator lymph node. This was in July of 2001. The patient returns to  this clinic because of an insurance problem but she had been followed by Dr.  Serita Kyle up until 1 year ago. She is totally asymptomatic, denies  any pelvic pain, her weight is stable at 137. She is not sexually active.  The patient is just here for a followup Pap smear.   PHYSICAL EXAMINATION:  The vagina is severe atrophic and contracted. The  apex is clean. Cytologies obtained from the apex on annual exam reveals no  nodularity of masses. Rectovaginal exam reveals no evidence of any pelvic  masses or nodules. The patient is to return in 1 year.           ______________________________  Ellis Parents, MD     SA/MEDQ  D:  10/23/2005  T:  10/24/2005  Job:  [id number]

## 2010-10-26 NOTE — H&P (Signed)
[facility]  Patient:    [name] [name], [name] [name]                   MRN: [id number] Adm. Date:  [id number] Disc. Date: [id number] Attending:  Jeannette Corpus CC:         Billie Lade, M.D.             Valentino Hue. Magrinat, M.D.             Roseanna Rainbow, M.D.             Telford Nab, R.N.                         History and Physical  HISTORY OF PRESENT ILLNESS:  This 45 year old African-American female returns for her surgical check and discussion of treatment planning.  She was explored on June 26 for consideration of a radical hysterectomy, but unfortunately was found to have a 5 x 6 cm right obturator lymph node, which was resected in its entirety.  It contained metastatic squamous cell carcinoma.  Because of this she had a grossly enlarged node and it was felt that radiation therapy would be most appropriate, and therefore surgical staging was performed.  She had pelvic lymphadenectomy which revealed metastasis in the opposite lymph nodes (both right and left were involved).  Five periaortic lymph nodes were obtained which were all benign with no evidence of metastatic disease.  The patient really did not have any evidence of involvement of the perimetria.  She has had an uncomplicated postoperative course.  PHYSICAL EXAMINATION:  VITAL SIGNS:  Weight 125 pounds.  ABDOMEN:  The abdomen was soft and nontender.  Her transverse incision is healing well.  IMPRESSION:  Stage I-D squamous cell carcinoma of the cervix with bilateral pelvic lymph node metastases.  She has no gross residual disease in her lymph nodes and periaortic nodes are negative.  After a lengthy consultation with the patient regarding these findings it would be my recommendation the patient receive whole pelvis radiation therapy (radical) with intracavitary brachy therapy.  I would also recommend that she receive concurrent weekly Cisplatin chemotherapy as a  radiation sensitizer. The rationale for this approach is discussed with the patient and we also reviewed a potential radiation consultation.  She was given an appointment to see Dr. Antony Blackbird on July 13 at 8:30 and to see Dr. Ruthann Cancer on July 18 at 3:15 p.m. and they will coordinate her therapy.  I would like to see the patient back for a six week postoperative checkup and then approximately one month following completion of her radiation and chemotherapy. DD:  12/19/99 TD:  12/19/99 Job: [id number]

## 2010-10-26 NOTE — H&P (Signed)
[facility]  Patient:    [name] [name], [name] [name]                   MRN: [id number] Adm. Date:  [id number] Disc. Date: [id number] Attending:  Michaelle Copas CC:         GYN Clinic                         History and Physical  CHIEF COMPLAINT:              The patient is a 45 year old, para 2, with last normal menstrual period in January of 2001, with cervical biopsy consistent with squamous cell carcinoma with a focci suspicious for invasion, who presents for  cold knife conization.  HISTORY OF PRESENT ILLNESS:   The patient describes abnormal vaginal bleeding for approximately four to five months.  The bleeding is very eradic.  She has some concommitant sharp pelvic pain and frequent urination.  She, however, denies any changes in weight or bowel habits.  She denies any tobacco use or any history of any sexually transmitted diseases.  The patient was diagnosed with cervical dysplasia approximately two years ago and is status post cryocautery of the cervix. However, the patient did not return for follow-up Pap smears.  She presented to  [facility] approximately a week or two prior and was noted to have an abnormal appearing cervix and was sent to [facility] for further evaluation.  She underwent a biopsy and Pap smear on May 4, with the biopsy demonstrating again squamous cell carcinoma with focci suspicious for invasion. An ultrasound at that point demonstrated uterus that was upper limits of normal in  size and a cervix that was normal in size and caliber.  ALLERGIES:                    No known drug allergies.  MEDICATIONS:                  Oral contraceptives.  PAST MEDICAL HISTORY:         Peptic ulcer disease.  PAST SURGICAL HISTORY:        Oral surgery.  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  She denies any history of substance abuse or ethanol abuse.  FAMILY HISTORY:                Remarkable for hypertension.  PHYSICAL EXAMINATION:  VITAL SIGNS:                  Pulse is 68, blood pressure 131/83, weight 126.9 pounds.  GENERAL:                      She is a thin, African-American female in no acute distress.  HEENT:                        Normocephalic and atraumatic.  NECK:                         No thyromegaly.  HEART:                        Regular rate and rhythm without murmurs, rubs, or  gallops.  BREASTS:  No masses, no discharge, nontender bilaterally.  ABDOMEN:                      Thin.  Minimal suprapubic tenderness, soft. Adenopathy not assessed.  PELVIC:                       She has normal external female genitalia.  On speculum examination, the anterior portion of the portio appears erosive. There is minimal active bleeding.  On bimanual examination, the cervix is somewhat bulbous, however, mobile.  There is a suggestion of an anterior myoma.  The axis of the uterus is axial.  The parametrium appear to be free.  The adnexa are nonpalpable and nontender bilaterally.  Rectovaginal examination is confirmatory.  EXTREMITIES:                  No clubbing, cyanosis, or edema.  ASSESSMENT:                   Likely early squamous cell carcinoma of the cervix.  PLAN:                         Cold knife conization. DD:  10/16/99 TD:  10/16/99 Job: [id number]

## 2010-10-26 NOTE — Consult Note (Signed)
[facility]  Patient:    [name] [name], [name] [name]                   MRN: [id number] Proc. Date: 11/18/00 Adm. Date:  [id number] Attending:  Jeannette Corpus CC:         Billie Lade, M.D.  Valentino Hue. Magrinat, M.D.  Redmond Baseman, M.D.  Dagmar Hait, R.N.   Consultation Report  HISTORY OF PRESENT ILLNESS:  A 45 year old African-American female who returns for continued followup of cervical cancer.  She had clinical stage IB disease, but at time of exploration for radical hysterectomy, she had bilateral positive nodes, but negative pelvic para-aortic lymph nodes.  A radical hysterectomy was aborted, and she underwent primary radiation therapy and intracavitary cesium application and combined with weekly cisplatin chemotherapy.  INTERVAL HISTORY:  Since her last the patient has done well.  She denies any GI or GU symptoms.  She has no pelvic pain, pressure, vaginal bleeding, or discharge.  She is using her Premarin cream as prescribed, but is not sexually active by her own choice.  REVIEW OF SYSTEMS:  She has no gynecologic, GI/GU, cardiovascular, or pulmonary symptoms.  She is fully functional.  PHYSICAL EXAMINATION:  VITAL SIGNS:  Weight 127 pounds, blood pressure 108/74.  GENERAL:  The patient is a healthy, African-American female in no acute distress.  HEENT:  Negative.  NECK:  Supple without thyromegaly.  There is no supraclavicular or inguinal adenopathy.  ABDOMEN:  Soft, nontender, and no mass, organomegaly, ascites, or hernia is noted.  Transverse incision is well-healed.  PELVIC:  EGBUS normal.  Vagina normal.  No lesions noted.  Bimanual and rectovaginal reveal no masses or _____.  IMPRESSION:  Stage IB squamous cell carcinoma of the cervix with bilateral pelvic nodal metastases, status post radiation therapy with concurrent chemotherapy.  The patient is now clinically free of disease one year since initial  surgery.  PLAN:  Pap smears were obtained, and she will return to see Dr. Billie Lade in three months and return to see [place] four months thereafter (January 2003). DD:  11/18/00 TD:  11/18/00 Job: [id number]

## 2010-10-26 NOTE — Consult Note (Signed)
NAME:  [name] [name], [name] [name]                      ACCOUNT NO.:  [id number]   MEDICAL RECORD NO.:  [id number]                   PATIENT TYPE:  OUT   LOCATION:  GYN                                  FACILITY:  [facility]   PHYSICIAN:  De Blanch, M.D.         DATE OF BIRTH:  [birth year]   DATE OF CONSULTATION:  DATE OF DISCHARGE:                                   CONSULTATION   HISTORY OF PRESENT ILLNESS:  The patient is a 45 year old African-American  female who returns for continuing follow up of a stage 1B squamous cell  carcinoma of the cervix. Since her last  visit she has done well. She denies  any GI or GU symptoms and has no pelvic pain, pressure, vaginal bleeding or  discharge and her functional status is excellent. In the interval she has  been evaluated by Dr. Leary Roca regarding rectal bleeding which was  attributed to a fissure which has now responded to conservative therapy and  she has no further rectal bleeding or any rectal pain.   HISTORY OF PRESENT ILLNESS:  The patient underwent exploratory laparotomy in  June 2001 for a stage 1B squamous cell carcinoma of the cervix. The radical  hysterectomy was aborted because the patient had bilaterally enlarged pelvic  lymph nodes. She received postoperative pelvic radiation therapy and  concurrent cisplatin radiation sensitization.  She had no particular  complications.   REVIEW OF SYSTEMS:  Reveals no GI,GU, cardiovascular, pulmonary,  musculoskeletal or  neurological symptoms.   FAMILY HISTORY:  Reviewed and unchanged.   SOCIAL HISTORY:  The patient does not  smoke. She currently is working for  [employer].   PHYSICAL EXAMINATION:  GENERAL:  Weight 130 pounds. In general the patient  is a healthy female in no acute distress.  VITAL SIGNS:  Blood pressure 110/80.  HEENT:  Negative.  NECK:  Supple without thyromegaly.  LYMPH:  There is no supraclavicular or inguinal adenopathy.  ABDOMEN:  Soft, nontender,  no masses, hepatosplenomegaly or hernias noted. A  transverse incision is well healed.  PELVIC:  EGBUS, vagina, bladder urethra are normal. The  cervix is not  visualized, as the upper vagina is agglutinated. Bimanual and rectovaginal  examination reveals no masses, induration or nodularity. The uterus seems to  be small.  EXTREMITIES:  The lower extremities are without edema or varicosities.   IMPRESSION:  Stage 1B squamous cell carcinoma of the cervix with bilateral  metastatic pelvic adenopathy. The patient seems to be clinically free of  disease, now with two and a half years of followup.   PLAN:  Pap smears were obtained. The patient will return to see Dr. Billie Lade in six months, return to see me in one year.  De Blanch, M.D.    DC/MEDQ  D:  05/04/2002  T:  05/04/2002  Job:  [id number]   cc:   Billie Lade, M.D.  [address] [facility]  [place]  [place]  [address]  Fax: [phone]   Valentino Hue. Magrinat, M.D.  [address] [facility]  [place]  [place] [address]  Fax: [phone]   Telford Nab, R.N.  [address] [place], [place] [address]  Fax: 1   Maryla Morrow. [place] Charon, M.D.

## 2010-10-26 NOTE — Consult Note (Signed)
[facility]  Patient:    [name] [name], [name] [name]                   MRN: [id number] Proc. Date: 11/21/99 Adm. Date:  [id number] Attending:  Jeannette Corpus CC:         Roseanna Rainbow, M.D.             Telford Nab, R.N.                          Consultation Report  HISTORY OF PRESENT ILLNESS:  A 45 year old black single female referred by Dr. Antionette Char for management of a newly diagnosed squamous cell carcinoma of the cervix. The patient has not had a pap smear in a number of years and presented with abnormal bleeding to the gynecology clinic. Pap smears obtained showing cells consistent with high-grade dysplasia. Colposcopy, biopsy and ultimately cold knife conization was performed on May 15. Final pathology, conization of specimen showed a moderately differentiated squamous cell carcinoma with vascular space involvement and involvement of surgical margins. Subsequently, the patient has undergone a CAT scan of the abdomen and pelvis which showed no evidence of metastatic disease or adenopathy.  The patient notes that she has had some vaginal spotting but no significant pain or pressure.  PAST MEDICAL HISTORY:  MEDICAL ILLNESSES:  None.  PAST SURGICAL HISTORY:  Extraction of wisdom teeth.  CURRENT MEDICATIONS:  None.  ALLERGIES:  No known drug allergies.  FAMILY HISTORY:  Negative for gynecologic or breast cancer.  SOCIAL HISTORY:  The patient does not smoke. She works as a Chief Technology Officer. She lives at home with her 2 children who are 17 and 39 years of age.  REVIEW OF SYSTEMS:  Negative for breast, cardiovascular, pulmonary, neurologic, GI or GU symptoms.  PHYSICAL EXAMINATION:  VITAL SIGNS:  Height 5 foot 5, weight 132 pounds, blood pressure 130/80, pulse 78, respiratory rate 18.  GENERAL:  The patient is a healthy, slender, African-American female in no acute distress.  HEENT:  Negative.  NECK:   Supple without thyromegaly. There is no supraclavicular, axillary or inguinal adenopathy.  ABDOMEN:  Soft and nontender. No mass, organomegaly or ascites are noted. She does seem to have stool in her left colon.  PELVIC:  EGBUS is normal. The vagina is clean. The cervix has a number of sutures in it from the conization but is not bleeding actively. Bimanual exam reveals that the cervix is approximately 3-4 cm in diameter. I do not feel any parametrial involvement and the uterus is normal size. There is no adnexal mass.  RECTOVAGINAL:  Confirms.  IMPRESSION: 1. 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 I had a lengthy discussion    with the patient and her friend regarding management options. They are    informed that this could be managed either with surgery or radiation    therapy. The risks and benefits of both were discussed and after    considering this, the patient wishes to proceed with the surgery which    we will schedule for June 26. She is aware that based on pathologic    findings we may need to recommend postoperative radiation therapy or    chemotherapy or both. The risks of surgery including hemorrhage, infection,    injury to adjacent viscera, thromboembolic complications, anesthetic risks    and bladder dysfunction were discussed. In particular, I emphasized the    potential for  bladder dysfunction which would require prolonged use of a    suprapubic catheter. The patient voiced an understanding of these issues    and wishes to proceed and accepts the risks outlined. DD:  11/21/99 TD:  11/21/99 Job: [id number]

## 2010-10-26 NOTE — Consult Note (Signed)
NAME:  [name] [name], [name] [name]                      ACCOUNT NO.:  [id number]   MEDICAL RECORD NO.:  [id number]                   PATIENT TYPE:  OUT   LOCATION:  GYN                                  FACILITY:  [facility]   PHYSICIAN:  De Blanch, M.D.         DATE OF BIRTH:  Sep 03, 1965   DATE OF CONSULTATION:  07/19/2003  DATE OF DISCHARGE:                                   CONSULTATION   REASON FOR CONSULTATION:  A 45 year old African-American female returns for  continuing followup of stage IB squamous cell carcinoma of the cervix.   INTERVAL HISTORY:  Since her last visit, she has done well.  She did have  some rectal bleeding and was evaluated by Dr. Vida Rigger with colonoscopy.  She was found to have some mild radiation proctitis, treated with the Argon  beam coagulator.  In addition, she has had a small polyp which was removed.  Otherwise, she has had no gynecologic symptoms, and has no bladder symptoms.  Functional status is excellent.  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   HISTORY OF PRESENT ILLNESS:  The patient underwent exploratory laparotomy in  June 2001, for stage IB squamous cell carcinoma of the cervix.  Radical  hysterectomy was aborted because she had bilaterally enlarged pelvic lymph  nodes.  She received postoperative radiation therapy and concurrent  cisplatin chemotherapy.   PAST MEDICAL HISTORY:  No medical illnesses.   ALLERGIES:  No known drug allergies.   PAST SURGICAL HISTORY:  Exploratory laparotomy for cervical cancer.   FAMILY HISTORY:  Negative for gynecologic, breast, or colon cancer.   SOCIAL HISTORY:  The patient does not smoke.  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   REVIEW OF SYSTEMS:  Otherwise negative.   PHYSICAL EXAMINATION:  VITAL SIGNS:  Weight 125 pounds, blood pressure  110/78.  GENERAL:  The patient is a healthy African-American female in no acute  distress.  HEENT:  Negative.  NECK:  Supple without thyromegaly.  There  is no supraclavicular or inguinal  adenopathy.  ABDOMEN:  Soft, nontender.  No mass, organomegaly, ascites, or hernias are  noted.  PELVIC:  EGBUS, vagina, bladder, and urethra are normal.  No lesions noted.  Bimanual and rectovaginal exam reveal no mass, induration, or nodularity.   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   PLAN:  Pap smears are obtained.  The patient will return to see Dr. Arnette Schaumann in six months, and return to see [place] in one year.                                               De Blanch, M.D.    DC/MEDQ  D:  07/19/2003  T:  07/19/2003  Job:  [id number]   cc:   Billie Lade, M.D.  [address] - [facility]  [place]  [place] [address]  Fax: [phone]   Telford Nab, R.N.  [phone] [address]  [place], [place] [address]   Valentino Hue. Magrinat, M.D.  [address] [facility]  [place]  [place] [address]  Fax: [phone]   Maryla Morrow. Modesto Charon, M.D.  [address]  [place]  [place] [address]  Fax: [phone]   Roseanna Rainbow, M.D.  [address]  [place]  [place] [address]  Fax: [phone]

## 2010-10-26 NOTE — Consult Note (Signed)
NAME:  [name] [name], [name] [name]                      ACCOUNT NO.:  [id number]   MEDICAL RECORD NO.:  [id number]                   PATIENT TYPE:  OUT   LOCATION:  GYN                                  FACILITY:  [facility]   PHYSICIAN:  De Blanch, M.D.         DATE OF BIRTH:  10-Jul-1965   DATE OF CONSULTATION:  11/08/2003  DATE OF DISCHARGE:                                   CONSULTATION   REASON FOR CONSULTATION:  A 45 year old African-American female who returns  for continuing follow-up of cervical cancer.  She also at her last visit had  an ASCUS Pap smear.  Since March she has been using Premarin cream twice a  week.  She returns today for colposcopy, repeat Pap smear, and further  evaluation.   The patient reports she has not had any pelvic pain, pressure, GI, or GU  symptoms.  Her functional status is excellent.   HISTORY OF PRESENT ILLNESS:  The patient underwent exploratory laparotomy in  June 2001 for a stage Ib squamous cell carcinoma of the cervix.  Radical  hysterectomy was aborted because she had bilaterally enlarged pelvic lymph  nodes.  She was treated with postoperative radiation therapy and concurrent  cisplatin chemotherapy and had a complete response.   PAST MEDICAL HISTORY:  Medical illnesses:  None.   DRUG ALLERGIES:  None.   PAST SURGICAL HISTORY:  Exploratory laparotomy for cervical cancer.   FAMILY HISTORY:  Negative for gynecologic, breast, or colon cancer.   SOCIAL HISTORY:  The patient is a high Dance movement psychotherapist.  She  does not smoke.  She is engaged to be married in September 2005.   REVIEW OF SYSTEMS:  Negative except as noted above.   PHYSICAL EXAMINATION:  VITAL SIGNS:  Weight 128 pounds.  GENERAL:  The patient is a healthy black female in no acute distress.  HEENT:  Negative.  NECK:  Supple without thyromegaly.  LYMPH:  There is no supraclavicular or inguina adenopathy.  ABDOMEN:  Soft, nontender.  No mass, organomegaly, ascites,  or hernias  noted.  Transverse incision is well healed.  PELVIC:  EG/BUS, vagina, bladder, urethra are normal.  No lesions are noted.  Bimanual rectovaginal exam revealed no masses, induration, or nodularity.   PROCEDURE NOTE:  Colposcopic examination is performed of the entire vagina.  There remain some atrophic changes but no lesions are noted.   IMPRESSION:  1. Stage Ib squamous cell carcinoma of the cervix with bilateral metastatic     pelvic lymph nodes status post radiation therapy and concurrent     chemotherapy; no evidence of recurrent disease.  2. Recent atypical squamous cells of undetermined significance on Pap smear     status post use of Premarin cream.  I see no lesions.   PLAN:  Pap smear is repeated.  The patient will return to see [place] in 6 months  unless her Pap smear is abnormal.  She will continue using Premarin cream.  She is anticipating getting married this fall and I reassured her I felt  that she would be able to resume having sexual intercourse, based on her  current anatomy.                                               De Blanch, M.D.    DC/MEDQ  D:  11/08/2003  T:  11/08/2003  Job:  [id number]   cc:   Telford Nab, R.N.  [address]  [place], [place] [address]   Billie Lade, M.D.  [address] [facility]  [place]  [place] [address]  Fax: [phone]   Valentino Hue. Magrinat, M.D.  [address] [facility] [address]  [place]  [place] [address]  Fax: [phone]   Maryla Morrow. Modesto Charon, M.D.  [address]  [place]  [place] [address]  Fax: [phone]

## 2010-10-26 NOTE — Op Note (Signed)
[facility]  Patient:    [name] [name], [name] [name]                   MRN: [id number] Proc. Date: 10/23/99 Adm. Date:  [id number] Attending:  Donne Hazel                           Operative Report  PREOPERATIVE DIAGNOSIS:       This patient is admitted for a cone biopsy of the  cervix to rule out invasive squamous cell carcinoma of the cervix.  POSTOPERATIVE DIAGNOSIS:  OPERATION:  SURGEON:                      Donney Rankins, M.D.  ASSISTANT:  ANESTHESIA:                   General anesthesia.  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  DESCRIPTION OF PROCEDURE:     Under general anesthesia, speculum examination revealed the cervix to be markedly hypertrophic with a severe erosive feature of the anterior lip with associated hypertrophy and friability.  The posterior lip of the cervix appeared normal.  The uterus was in midposition and normal in size and both adnexa were soft.  An angle suture of 0 PDS was placed at 3 oclock and at  [employer].  Following this, with the #11 blade, a generous cone biopsy was taken primarily obtaining specimen from the anterior lip to a depth of at least 7 to  mm.  Following this, a pursestring suture of 0 PDS was placed around the cervix and tied resulting in almost complete hemostasis.  For additional hemostasis, a second pursestring suture was placed on the portio closer to the external os.  Tying this resulted in complete hemostasis.  The patient was given a prescription for doxycycline 100 mg b.i.d. for seven days and is to report back in the clinic in 10 to 12 days for examination. DD:  10/23/99 TD:  10/23/99 Job: [id number]

## 2011-03-22 ENCOUNTER — Other Ambulatory Visit (HOSPITAL_COMMUNITY): Payer: Self-pay | Admitting: Family Medicine

## 2011-03-22 DIAGNOSIS — Z1231 Encounter for screening mammogram for malignant neoplasm of breast: Secondary | ICD-10-CM

## 2011-04-05 ENCOUNTER — Ambulatory Visit (HOSPITAL_COMMUNITY): Payer: Medicaid Other

## 2011-04-29 ENCOUNTER — Ambulatory Visit (HOSPITAL_COMMUNITY): Payer: Medicaid Other | Attending: Family Medicine

## 2011-05-10 ENCOUNTER — Ambulatory Visit (HOSPITAL_COMMUNITY)
Admission: RE | Admit: 2011-05-10 | Discharge: 2011-05-10 | Disposition: A | Discharge status: Home / Self Care | Payer: Medicaid Other | Source: Ambulatory Visit | Attending: Family Medicine | Admitting: Family Medicine

## 2011-05-10 DIAGNOSIS — Z1231 Encounter for screening mammogram for malignant neoplasm of breast: Secondary | ICD-10-CM | POA: Insufficient documentation

## 2011-06-11 DIAGNOSIS — I2699 Other pulmonary embolism without acute cor pulmonale: Secondary | ICD-10-CM

## 2011-06-11 HISTORY — DX: Other pulmonary embolism without acute cor pulmonale: I26.99

## 2012-06-10 HISTORY — PX: KIDNEY STONE SURGERY: SHX686

## 2012-10-22 DIAGNOSIS — M5000 Cervical disc disorder with myelopathy, unspecified cervical region: Secondary | ICD-10-CM | POA: Insufficient documentation

## 2013-09-02 ENCOUNTER — Other Ambulatory Visit (HOSPITAL_COMMUNITY): Payer: Self-pay | Admitting: Family Medicine

## 2013-09-02 DIAGNOSIS — Z1231 Encounter for screening mammogram for malignant neoplasm of breast: Secondary | ICD-10-CM

## 2013-09-07 ENCOUNTER — Ambulatory Visit (HOSPITAL_COMMUNITY)
Admission: RE | Admit: 2013-09-07 | Discharge: 2013-09-07 | Disposition: A | Discharge status: Home / Self Care | Payer: 59 | Source: Ambulatory Visit | Attending: Family Medicine | Admitting: Family Medicine

## 2013-09-07 DIAGNOSIS — Z1231 Encounter for screening mammogram for malignant neoplasm of breast: Secondary | ICD-10-CM

## 2014-01-03 ENCOUNTER — Other Ambulatory Visit: Payer: Self-pay | Admitting: Obstetrics and Gynecology

## 2014-01-21 ENCOUNTER — Ambulatory Visit: Payer: 59 | Attending: Gynecology | Admitting: Gynecology

## 2014-01-21 ENCOUNTER — Encounter: Payer: Self-pay | Admitting: Gynecology

## 2014-01-21 VITALS — BP 119/70 | HR 75 | Temp 98.6°F | Resp 20 | Wt 130.1 lb

## 2014-01-21 DIAGNOSIS — D860 Sarcoidosis of lung: Secondary | ICD-10-CM | POA: Insufficient documentation

## 2014-01-21 DIAGNOSIS — K219 Gastro-esophageal reflux disease without esophagitis: Secondary | ICD-10-CM | POA: Insufficient documentation

## 2014-01-21 DIAGNOSIS — Z923 Personal history of irradiation: Secondary | ICD-10-CM | POA: Diagnosis not present

## 2014-01-21 DIAGNOSIS — IMO0002 Reserved for concepts with insufficient information to code with codable children: Secondary | ICD-10-CM | POA: Diagnosis not present

## 2014-01-21 DIAGNOSIS — Z9221 Personal history of antineoplastic chemotherapy: Secondary | ICD-10-CM | POA: Insufficient documentation

## 2014-01-21 DIAGNOSIS — I2699 Other pulmonary embolism without acute cor pulmonale: Secondary | ICD-10-CM | POA: Insufficient documentation

## 2014-01-21 DIAGNOSIS — C519 Malignant neoplasm of vulva, unspecified: Secondary | ICD-10-CM | POA: Insufficient documentation

## 2014-01-21 DIAGNOSIS — Z86711 Personal history of pulmonary embolism: Secondary | ICD-10-CM | POA: Insufficient documentation

## 2014-01-21 DIAGNOSIS — D869 Sarcoidosis, unspecified: Secondary | ICD-10-CM | POA: Insufficient documentation

## 2014-01-21 DIAGNOSIS — N200 Calculus of kidney: Secondary | ICD-10-CM | POA: Insufficient documentation

## 2014-01-21 DIAGNOSIS — G4730 Sleep apnea, unspecified: Secondary | ICD-10-CM | POA: Insufficient documentation

## 2014-01-21 DIAGNOSIS — C539 Malignant neoplasm of cervix uteri, unspecified: Secondary | ICD-10-CM | POA: Insufficient documentation

## 2014-01-21 DIAGNOSIS — D071 Carcinoma in situ of vulva: Secondary | ICD-10-CM

## 2014-01-21 NOTE — Patient Instructions (Signed)
Follow up with Dr. Berneta Sages office

## 2014-01-21 NOTE — Progress Notes (Signed)
Consult Note: Gyn-Onc   [name] [name] 48 y.o. female  Chief Complaint  Patient presents with  . 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 Charlesetta Garibaldi. She is given warning signs of any new lesions including a wartlike growth thickening of the skin, or pruritus.  HPI: Patient is seen in consultation at the request of Dr. Charlesetta Garibaldi regarding a newly diagnosed vulvar dysplasia (high-grade and (apparently the patient is entirely asymptomatic but on routine examination was found to have an exophytic lesion of the vulva which was excised by Dr. Charlesetta Garibaldi. Final pathology showed this to be a high-grade dysplasia. It's noted that the surgical margins were free of disease. The patient has not had any past history of these problems. It is noted she is on chronic prednisone for sarcoidosis.  Patient has a history of cervical cancer in 2001. She was found to have metastatic disease of lymph nodes at the time of  planned radical hysterectomy. The procedure was abandoned and the patient was treated with chemotherapy and radiation therapy. She is a 8 year survivor with no significant sequelae. Review of Systems:10 point review of systems is negative except as noted in interval history.   Vitals: Blood pressure 119/70, pulse 75, temperature 98.6 F (37 C), temperature source Oral, resp. rate 20, weight 130 lb 1.6 oz (59.013 kg).  Physical Exam: General : The patient is a healthy woman in no acute distress.  HEENT: normocephalic, extraoccular movements normal; neck is supple without thyromegally  Lynphnodes: Supraclavicular and inguinal nodes not enlarged  Abdomen: Soft, non-tender, no ascites, no organomegally, no masses, no hernias  Pelvic:  EGBUS: Normal female no lesions are noted and the biopsy site is well healed. Vagina: Normal, no lesions  Urethra  and Bladder: Normal, non-tender  Cervix: Surgically absent  Uterus: Surgically absent  Bi-manual examination: Non-tender; no adenxal masses or nodularity  Rectal: normal sphincter tone, no masses, no blood  Lower extremities: No edema or varicosities. Normal range of motion      No Known Allergies  Past Medical History  Diagnosis Date  . Cervical cancer   . Sarcoidosis   . Pulmonary embolism 2013  . Kidney stones   . Lung collapse 2012    Past Surgical History  Procedure Laterality Date  . Kidney stone surgery  2014  . Lymphadenectomy  2001  . Nasal sinus surgery  2006    Current Outpatient Prescriptions  Medication Sig Dispense Refill  . predniSONE (DELTASONE) 20 MG tablet Take 20 mg by mouth as needed.       No current facility-administered medications for this visit.    History   Social History  . Marital Status: Single    Spouse Name: N/A    Number of Children: N/A  . Years of Education: N/A   Occupational History  . Not on file.   Social History Main Topics  . Smoking status: Never Smoker   . Smokeless tobacco: Not on file  . Alcohol Use: No  . Drug Use: No  . Sexual Activity: Not on file   Other Topics Concern  . Not on file   Social History Narrative  . No narrative on file    Family History  Problem Relation Age of Onset  . Hypertension Mother   . Hypertension Father   . Rheum arthritis Sister  Alvino Chapel, MD 01/21/2014, 12:35 PM       Consult Note: Gyn-Onc   [name] [name] 48 y.o. female  Chief Complaint  Patient presents with  . Vulvar Cancer    New patient    Assessment :  Plan:  Interval History:   HPI:  Review of Systems:10 point review of systems is negative except as noted in interval history.   Vitals: Blood pressure 119/70, pulse 75, temperature 98.6 F (37 C), temperature source Oral, resp. rate 20, weight 130 lb 1.6 oz (59.013 kg).  Physical Exam: General : The patient is a  healthy woman in no acute distress.  HEENT: normocephalic, extraoccular movements normal; neck is supple without thyromegally  Lynphnodes: Supraclavicular and inguinal nodes not enlarged  Abdomen: Soft, non-tender, no ascites, no organomegally, no masses, no hernias  Pelvic:  EGBUS: Normal female  Vagina: Normal, no lesions  Urethra and Bladder: Normal, non-tender  Cervix: Surgically absent  Uterus: Surgically absent  Bi-manual examination: Non-tender; no adenxal masses or nodularity  Rectal: normal sphincter tone, no masses, no blood  Lower extremities: No edema or varicosities. Normal range of motion      No Known Allergies  Past Medical History  Diagnosis Date  . Cervical cancer   . Sarcoidosis   . Pulmonary embolism 2013  . Kidney stones   . Lung collapse 2012    Past Surgical History  Procedure Laterality Date  . Kidney stone surgery  2014  . Lymphadenectomy  2001  . Nasal sinus surgery  2006    Current Outpatient Prescriptions  Medication Sig Dispense Refill  . predniSONE (DELTASONE) 20 MG tablet Take 20 mg by mouth as needed.       No current facility-administered medications for this visit.    History   Social History  . Marital Status: Single    Spouse Name: N/A    Number of Children: N/A  . Years of Education: N/A   Occupational History  . Not on file.   Social History Main Topics  . Smoking status: Never Smoker   . Smokeless tobacco: Not on file  . Alcohol Use: No  . Drug Use: No  . Sexual Activity: Not on file   Other Topics Concern  . Not on file   Social History Narrative  . No narrative on file    Family History  Problem Relation Age of Onset  . Hypertension Mother   . Hypertension Father   . Rheum arthritis Sister       Alvino Chapel, MD 01/21/2014, 12:35 PM

## 2014-08-15 ENCOUNTER — Ambulatory Visit: Payer: 59

## 2014-08-15 ENCOUNTER — Encounter (HOSPITAL_COMMUNITY): Payer: Self-pay | Admitting: Emergency Medicine

## 2014-08-15 ENCOUNTER — Emergency Department (INDEPENDENT_AMBULATORY_CARE_PROVIDER_SITE_OTHER)
Admission: EM | Admit: 2014-08-15 | Discharge: 2014-08-15 | Disposition: A | Discharge status: Home / Self Care | Payer: 59 | Source: Home / Self Care | Attending: Emergency Medicine | Admitting: Emergency Medicine

## 2014-08-15 DIAGNOSIS — J309 Allergic rhinitis, unspecified: Secondary | ICD-10-CM

## 2014-08-15 DIAGNOSIS — H1013 Acute atopic conjunctivitis, bilateral: Secondary | ICD-10-CM

## 2014-08-15 DIAGNOSIS — H6123 Impacted cerumen, bilateral: Secondary | ICD-10-CM

## 2014-08-15 DIAGNOSIS — J019 Acute sinusitis, unspecified: Secondary | ICD-10-CM

## 2014-08-15 MED ORDER — CROMOLYN SODIUM 4 % OP SOLN: 1.0000 [drp] | Freq: Four times a day (QID) | OPHTHALMIC | Status: DC

## 2014-08-15 MED ORDER — FLUTICASONE PROPIONATE 50 MCG/ACT NA SUSP: 2.0000 | Freq: Every day | NASAL | Status: DC

## 2014-08-15 MED ORDER — CEFDINIR 300 MG PO CAPS: 300.0000 mg | ORAL_CAPSULE | Freq: Two times a day (BID) | ORAL | Status: DC

## 2014-08-15 MED ORDER — CETIRIZINE HCL 10 MG PO CAPS: ORAL_CAPSULE | ORAL | Status: DC

## 2014-08-15 NOTE — ED Notes (Signed)
Patient c/o ear being impacted and sinus pressure x 1 week. Patient states she has used ear drops, sweet oil and sinus medication for sinus pain. NAD. Alert and oriented.

## 2014-08-15 NOTE — Discharge Instructions (Signed)
People who suffer from allergies frequently have symptoms of nasal congestion, runny nose, sneezing, itching of the nose, eyes, ears or throat, mucous in the throat, watering of the eyes and cough.  These symptoms are caused by the body's immune response to environmental allergens.  For seasonal allergies this is pollen (tree pollen in the spring, grass pollen in the summer, and weed pollen in the fall).  Year round allergy symptoms are usually caused by dust or mould.  Many people have year round symptoms which are worse seasonally.  For people who have seasonal allergies, pollen avoidance may help to decease symptoms.  This means keeping windows in the house down and windows in the car up.  Run your air conditioning, since this filters out many of the pollen particles.  If you have to spend a prolonged time outdoors during heavy pollen season, it might be prudent to wear a mask.  These can be purchased at any drug store.  When you come in after heavy pollen exposure, your skin, clothing and hair are covered with pollen.  Changing your clothing, taking a shower, and washing your hair may help with your pollen exposure.  Also, your bedding, pillow, and pillowcase may become contaminated with pollen, so frequent washing of your bedding and pillowcase and changing out your pillow may help as well.  (Your pillow can also be a source of dust and mould exposure as well.)  Showering at bedtime may also help.  During heavy pollen season (April and September), a large amount of pollen gets trapped in your nasal cavity.  This can contribute to ongoing allergy symptoms.  Saline irrigation of the nasal cavity can help to remove this and relieve allergy symptoms.  This can be accomplished in several ways.  You can mix up your own saline solution using the following recipe:  8 oz of distilled or boiled water, 1/2 tsp of table salt (sodium choride), and a pinch of baking soda (sodium bicarbonate).  If nasal congestion is a  problem.  1 to 2 drops of Afrin solution can be added to this as well.  To do the irrigation, purchase a nasal bulb syringe (the kind you would use to clean out an infant's nose).  Fill this up with the solution, lean you head over a sink with the nostril to be irrigated turned upward, insert the syringe into your nostril, making a tight seal, and gently irrigate, compressing the bulb.  The solution will flow into your nostril and out the other, some may also come out of your mouth.  Repeat this on both sides.  You can do this once daily.  Do not store the solution, mix it up fresh each day.  A commercial solution, called Neomed Solution, can be purchased over the counter without prescription.  You can also use a Netti Pot for irrigation.  These can be purchased at your drug store as well.  Be sure to use distilled or boiled water in these as well and make sure the Netti pot is completely dry between uses.  Over the counter medications can be helpful, and in many cases can completely control allergy symptoms without resorting to more expensive prescription meds.   Antihistamines are the mainstay of allergy treatment.  The newer non-sedating antihistamines are all available over the counter.  These include Allegra, Zyrtec, and Claritin which also can be purchased in their generic forms: fexofenadine, cetirizine, and  Cetirizine.  Combining these meds with a decongestant such as pseudoephedrine or  phenylephrine helps with nasal congestion, but decongestants can also cause elevations in blood pressure.  Pseudoephedrine tends to be more effective than phenylephrine.  The older, more sedating antihistamines such as chlorpheniramine, brompheniramine, and diphenhydramine are also very effective, sometimes more so than the newer antihistamines, but with the price of more sedation.  You should be careful about driving or operating heavy machinery when taking sedating antihistamines, and men with enlarged prostates may  experience urinary retention with diphenhydramine.  Naslacrom nasal spray can be very effective for allergy symptoms.  It is available over the counter and has very few side effects.  The dosage is 2 sprays in each nostril twice daily.  It is recommended that you pinch your nose shut for 30 seconds after using it since it is a watery spray and can run out.  It can be used as long as needed.  There is no risk of dependency.  For people with year round allergies, dust, mould, insect emanations, and pet dander are usually the culprits.  To avoid dust, you need to avoid dust mites which are the main source of allergens in house dust.  Cover your bedding with moisture and mite impervious covers.  These can be purchased at any mattress store.  The modern covers are a little expensive, but not at all uncomfortable. Keeping your house as dry as possible will also help to control dust mites.  Do not use a humidifier and it may help to use a dehumidifier.  Use of a HEPA filter air filter is also a great way to reduce dust and mold exposure.  These units can be purchased commercially.  Make sure to buy one large enough for the room you intend to use it.  Change the filter as per the manufacturer's instructions.  Also, using a HEPA filter vacuum for your carpets is helpful.  There are chemicals that you can sprinkle on your carpet called acaricides that will kill dist mites.  The most commonly used brand is Acarosan.  This can be purchased on line.  It does have to be periodically reapplied.  Wash you pillows and bedsheets regularly in hot water.

## 2014-08-15 NOTE — ED Provider Notes (Signed)
Chief Complaint   Cerumen Impaction and Sinusitis   History of Present Illness   [name] [name] is a 49 year old female who presents tonight with a one-week history of left ear congestion and pressure. She denies any pain or drainage. Right ear has been normal. She also notes itchy, dry eyes and feel like they have gravel in them, nasal congestion, sneezing, rhinorrhea and some yellowish-green drainage, greenish postnasal drip, scratchy throat, and occasional dry cough. She denies any wheezing or fever. Does have a history of allergies and takes Claritin.  Review of Systems   Other than as noted above, the patient denies any of the following symptoms: Systemic:  No fevers or chills. Eye:  No redness, pain, discharge, itching, blurred vision, or diplopia. ENT:  No headache, nasal congestion, sneezing, itching, epistaxis, ear pain, decreased hearing, ringing in ears, vertigo, or tinnitus.  No oral lesions, sore throat, or hoarseness. Neck:  No neck pain or adenopathy. Skin:  No rash or itching.  [place]   Past medical history, family history, social history, meds, and allergies were reviewed. She has a prescription for prednisone but isn't taking it. 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  Physical Examination     Vital signs:  BP 114/80 mmHg  Pulse 65  Temp(Src) 98.1 F (36.7 C) (Oral)  Resp 16  SpO2 99% General:  Alert and oriented.  In no distress.   Eye:  PERRL, full EOMs, lids and conjunctiva normal.   ENT:  There was cerumen in both ear canals. After this was irrigated clear the canals and TMs appear normal.  Nasal mucosa was moderately congested with yellow drainage.  Mucous membranes moist, no oral lesions, normal dentition, pharynx clear. Mucous membranes were dry, there was some dried, thick, yellow postnasal drainage. No cranial or facial pain to palplation. There is no pain or swelling over the mastoid. Neck:  Supple, full ROM.  No adenopathy, tenderness or  mass.  Thyroid normal. Skin:  Clear, warm and dry.  Course in Urgent [place] were irrigated with tap water, with relief of symptoms and left ear, and TMs and canals appear normal afterwards.  Assessment   The primary encounter diagnosis was Impacted cerumen of both ears. Diagnoses of Allergic rhinitis, unspecified allergic rhinitis type, Allergic conjunctivitis, bilateral, and Acute sinusitis, recurrence not specified, unspecified location were also pertinent to this visit.  Plan    1.  Meds:  The following meds were prescribed:   Discharge Medication List as of 08/15/2014  9:59 PM    START taking these medications   Details  cefdinir (OMNICEF) 300 MG capsule Take 1 capsule (300 mg total) by mouth 2 (two) times daily., Starting 08/15/2014, Until Discontinued, Normal    Cetirizine HCl 10 MG CAPS Take 1 capsule daily, Normal    cromolyn (OPTICROM) 4 % ophthalmic solution Place 1 drop into both eyes 4 (four) times daily., Starting 08/15/2014, Until Discontinued, Normal    fluticasone (FLONASE) 50 MCG/ACT nasal spray Place 2 sprays into both nostrils daily., Starting 08/15/2014, Until Discontinued, Normal        2.  Patient Education/Counseling:  The patient was given appropriate handouts, self care instructions, and instructed in symptomatic relief.    3.  Follow up:  The patient was told to follow up here if no better in 3 to 4 days, or sooner if becoming worse in any way, and given some red flag symptoms such as fever, difficulty breathing, or severe headache or  ear pain which would prompt immediate return.       Harden Mo, MD 08/15/14 2236

## 2014-09-05 ENCOUNTER — Other Ambulatory Visit (HOSPITAL_COMMUNITY): Payer: Self-pay | Admitting: Obstetrics and Gynecology

## 2014-09-05 DIAGNOSIS — Z1231 Encounter for screening mammogram for malignant neoplasm of breast: Secondary | ICD-10-CM

## 2014-09-09 ENCOUNTER — Ambulatory Visit (HOSPITAL_COMMUNITY): Payer: 59

## 2014-09-13 ENCOUNTER — Ambulatory Visit (HOSPITAL_COMMUNITY): Payer: 59 | Attending: Obstetrics and Gynecology

## 2014-09-23 ENCOUNTER — Other Ambulatory Visit (HOSPITAL_COMMUNITY): Payer: Self-pay | Admitting: Obstetrics and Gynecology

## 2014-09-23 DIAGNOSIS — Z1231 Encounter for screening mammogram for malignant neoplasm of breast: Secondary | ICD-10-CM

## 2014-09-29 ENCOUNTER — Ambulatory Visit (HOSPITAL_COMMUNITY)
Admission: RE | Admit: 2014-09-29 | Discharge: 2014-09-29 | Disposition: A | Discharge status: Home / Self Care | Payer: 59 | Source: Ambulatory Visit | Attending: Obstetrics and Gynecology | Admitting: Obstetrics and Gynecology

## 2014-09-29 DIAGNOSIS — Z1231 Encounter for screening mammogram for malignant neoplasm of breast: Secondary | ICD-10-CM | POA: Diagnosis not present

## 2015-07-20 ENCOUNTER — Other Ambulatory Visit: Payer: Self-pay | Admitting: Obstetrics and Gynecology

## 2015-07-20 DIAGNOSIS — N644 Mastodynia: Secondary | ICD-10-CM

## 2015-07-20 DIAGNOSIS — N6452 Nipple discharge: Secondary | ICD-10-CM

## 2015-07-25 ENCOUNTER — Ambulatory Visit
Admission: RE | Admit: 2015-07-25 | Discharge: 2015-07-25 | Disposition: A | Discharge status: Home / Self Care | Payer: BLUE CROSS/BLUE SHIELD | Source: Ambulatory Visit | Attending: Obstetrics and Gynecology | Admitting: Obstetrics and Gynecology

## 2015-07-25 DIAGNOSIS — N644 Mastodynia: Secondary | ICD-10-CM

## 2015-07-25 DIAGNOSIS — N6452 Nipple discharge: Secondary | ICD-10-CM

## 2017-02-13 ENCOUNTER — Encounter: Payer: Self-pay | Admitting: Neurology

## 2017-02-17 ENCOUNTER — Telehealth: Payer: Self-pay | Admitting: Neurology

## 2017-02-17 ENCOUNTER — Institutional Professional Consult (permissible substitution): Payer: Self-pay | Admitting: Neurology

## 2017-02-17 NOTE — Telephone Encounter (Signed)
Pt no showed to apt at 1:00 pm today

## 2017-02-18 ENCOUNTER — Encounter: Payer: Self-pay | Admitting: Neurology

## 2017-05-22 ENCOUNTER — Encounter: Payer: Self-pay | Admitting: Neurology

## 2017-05-27 ENCOUNTER — Encounter: Payer: Self-pay | Admitting: Neurology

## 2017-05-27 ENCOUNTER — Ambulatory Visit: Payer: BLUE CROSS/BLUE SHIELD | Admitting: Neurology

## 2017-05-27 ENCOUNTER — Encounter (INDEPENDENT_AMBULATORY_CARE_PROVIDER_SITE_OTHER): Payer: Self-pay

## 2017-05-27 VITALS — BP 115/78 | HR 64 | Ht 65.0 in | Wt 149.0 lb

## 2017-05-27 DIAGNOSIS — G4710 Hypersomnia, unspecified: Secondary | ICD-10-CM | POA: Diagnosis not present

## 2017-05-27 DIAGNOSIS — G4730 Sleep apnea, unspecified: Secondary | ICD-10-CM | POA: Diagnosis not present

## 2017-05-27 DIAGNOSIS — D860 Sarcoidosis of lung: Secondary | ICD-10-CM

## 2017-05-27 DIAGNOSIS — F518 Other sleep disorders not due to a substance or known physiological condition: Secondary | ICD-10-CM

## 2017-05-27 DIAGNOSIS — G4736 Sleep related hypoventilation in conditions classified elsewhere: Secondary | ICD-10-CM

## 2017-05-27 DIAGNOSIS — I2729 Other secondary pulmonary hypertension: Secondary | ICD-10-CM

## 2017-05-27 DIAGNOSIS — G4701 Insomnia due to medical condition: Secondary | ICD-10-CM | POA: Diagnosis not present

## 2017-05-27 DIAGNOSIS — I2699 Other pulmonary embolism without acute cor pulmonale: Secondary | ICD-10-CM | POA: Diagnosis not present

## 2017-05-27 NOTE — Patient Instructions (Signed)
Sarcoidosis Sarcoidosis is a disease that causes inflammation in your organs and other areas of your body. The lungs are most often affected (pulmonary sarcoidosis). Sarcoidosis can also affect your lymph nodes, liver, eyes, skin, or any other body tissue. When you have sarcoidosis, small clumps of tissue (granulomas) form in the affected area of your body. Granulomas are made up of your body's defense (immune) cells. Inflammation results when your body reacts to a harmful substance. Normally, inflammation goes away after immune cells get rid of the harmful substance. In sarcoidosis, the immune cells form granulomas instead. What are the causes? The exact cause of sarcoidosis is not known. Something triggers the immune system to respond, such as dust, chemicals, bacteria, or a virus. What increases the risk? You may be at a greater risk for sarcoidosis if you:  Have a family history of the disease.  Are African American.  Are of Northern European ancestry.  Are 20-50 years old.  Are female.  What are the signs or symptoms? Many people with sarcoidosis have no symptoms. Others have very mild symptoms. Sarcoidosis most often affects the lungs. Symptoms include:  Chest pain.  Coughing.  Wheezing.  Shortness of breath.  Other common symptoms include:  Night sweats.  Weight loss.  Fatigue.  Depression.  A sense of uneasiness.  How is this diagnosed? Sarcoidosis may be diagnosed by:  Medical history and physical exam.  Chest X-ray. This looks for granulomas in your lungs.  Lung function tests. These measure your breathing and look for problems related to sarcoidosis.  Examining a sample of tissue under a microscope (biopsy).  How is this treated? Sarcoidosis usually clears up without treatment. You may take medicines to reduce inflammation or relieve symptoms. These may include:  Prednisone. This steroid reduces inflammation related to sarcoidosis.  Chloroquine or  hydroxychloroquine. These are antimalarial medicines used to treat sarcoidosis that affects the skin or brain.  Methotrexate, leflunomide, or azathioprine. These medicines affect the immune system and can help with sarcoidosis in the joints, eyes, skin, or lungs.  Inhalers. Inhaled medicines can help you breathe if sarcoidosis is affecting your lungs.  Follow these instructions at home:  Do not use any tobacco products, including cigarettes, chewing tobacco, or electronic cigarettes. If you need help quitting, ask your health care provider.  Avoid secondhand smoke.  Avoid irritating dust and chemicals. Stay indoors on days when air quality is poor in your area.  Take medicines only as directed by your health care provider. Contact a health care provider if:  You have vision problems.  You have shortness of breath.  You have a dry, persistent cough.  You have an irregular heartbeat.  You have pain or ache in your joints, hands, or feet.  You have an unexplained rash. Get help right away if: You have chest pain. This information is not intended to replace advice given to you by your health care provider. Make sure you discuss any questions you have with your health care provider. Document Released: 03/27/2004 Document Revised: 11/02/2015 Document Reviewed: 09/22/2013 Elsevier Interactive Patient Education  2018 Elsevier Inc.  

## 2017-05-27 NOTE — Progress Notes (Signed)
SLEEP MEDICINE CLINIC   Provider:  Larey Seat, M D  Primary Care Physician:  Smothers, Andree Elk, NP   Referring Provider: Smothers, Andree Elk, NP    Chief Complaint  Patient presents with  . New Patient (Initial Visit)    pt alone, rm 10. pt has been told she stops breathing in her sleep and that she snores. pt states she wakes up not feeling wel rested. states that she has woke her self up gasping for air.    HPI:  [name] [name] is a 51 y.o. female , seen herein a referral from N.P. Augusto Gamble for a sleep evaluation.   [name] [name] is a 51 year old right-handed African-American female patient with an interesting past medical and current medical history.  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 Her daughter has been most concerned abut her breathing , her gasping for breath , and snoring.    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  She does have trouble initiating sleep.  Her bedroom is cool, quiet and dark.  She is currently not treated with either CPAP or oxygen. She prefers sleeping on her back- helps her shoulder and joint pain.  She sleeps better when the head of bed is elevated, and sometimes for this reason sleeps on her couch.  Orthopnea has been noted for the last 3 years.She has nocturia three times at night.  She always wakes up at 5 AM spontaneously no matter if weekdays or weekends.She usually craves more sleep but just cannot go back to it.  She is not woken by pain, palpitations but sometimes by breath-holding spells. She reports vivid dreams, too.  She estimates that she gets about 5 hours of nocturnal sleep.   He does not nap in the daytime at all.    Sleep medical history and family sleep history:  Alzheimer dementia in maternal grandmother ( who was one of 64 daughters and 3 sons children ) and all but 2 of her  siblings had dementia.  [name] [name]'s medical history is reviewed and the first part of this report.  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  Lives alone, daughter sometimes stays with her-  No pets in the home.  Review of Systems: Out of a complete 14 system review, the patient complains of only the following symptoms, and all other reviewed systems are negative. Patient reports vivid dreams, nocturia, snoring, eye pain and blurred vision, headaches which arise during the day and are not present at night, numbness in the right upper extremity shoulder pain, joint pain, daytime sleepiness, witnessed snoring, nocturia seems to be related to hydronephrosis following a obstructing kidney stone.  Epworth score 16/ 24  , Fatigue severity score 28  , depression score 2 /15   Social History   Socioeconomic History  . Marital status: Single    Spouse name: Not on file  . Number of children: Not on  file  . Years of education: Not on file  . Highest education level: Not on file  Social Needs  . Financial resource strain: Not on file  . Food insecurity - worry: Not on file  . Food insecurity - inability: Not on file  . Transportation needs - medical: Not on file  . Transportation needs - non-medical: Not on file  Occupational History  . Not on file  Tobacco Use  . Smoking status: Never Smoker  . Smokeless tobacco: Never Used  Substance and Sexual Activity  . Alcohol use: No  . Drug use: No  . Sexual activity: Not on file  Other Topics  Concern  . Not on file  Social History Narrative  . Not on file    Family History  Problem Relation Age of Onset  . Hypertension Mother   . Asthma Mother   . Hypertension Father   . Rheum arthritis Sister   . Heart disease Maternal Grandmother   . Heart attack Paternal Grandmother   . Heart disease Paternal Grandmother   . Diabetes Paternal Grandmother     Past Medical History:  Diagnosis Date  . Cervical cancer ([place])   . GERD (gastroesophageal reflux disease)   . Kidney stones   . Lung collapse 2012  . Pulmonary embolism ([place]) 2013  . Sarcoidosis     Past Surgical History:  Procedure Laterality Date  . CYSTOSCOPY    . KIDNEY STONE SURGERY  2014  . LYMPHADENECTOMY  2001  . NASAL SINUS SURGERY  2006    Current Outpatient Medications  Medication Sig Dispense Refill  . Cetirizine HCl 10 MG CAPS Take 1 capsule daily 30 capsule 12  . cromolyn (OPTICROM) 4 % ophthalmic solution Place 1 drop into both eyes 4 (four) times daily. 10 mL 12  . fluticasone (FLONASE) 50 MCG/ACT nasal spray Place 2 sprays into both nostrils daily. 16 g 3  . predniSONE (DELTASONE) 20 MG tablet Take 20 mg by mouth as needed.     No current facility-administered medications for this visit.     Allergies as of 05/27/2017  . (No Known Allergies)    Vitals: BP 115/78   Pulse 64   Ht 5\' 5"  (1.651 m)   Wt 149 lb (67.6 kg)   BMI 24.79 kg/m  Last Weight:  Wt Readings from Last 1 Encounters:  05/27/17 149 lb (67.6 kg)   MPN:TIRW mass index is 24.79 kg/m.      Last Height:   Ht Readings from Last 1 Encounters:  05/27/17 5\' 5"  (1.651 m)    Physical exam:  General: The patient is awake, alert and appears not in acute distress. The patient is well groomed. Head: Normocephalic, atraumatic. Neck is supple. Mallampati 3,  neck circumference:14. Nasal airflow patent , Retrognathia is seen.  Cardiovascular:  Regular rate and rhythm , without  murmurs or carotid bruit, and without distended neck  veins. Respiratory: Lungs are clear to auscultation. Skin:  Without evidence of edema, or rash Trunk: BMI is 24.8. The patient's posture is erect   Neurologic exam : The patient is awake and alert, oriented to place and time.   Attention span & concentration ability appears normal.  Speech is fluent,  Without  dysarthria, dysphonia or aphasia.  Mood and affect are appropriate.  Cranial nerves: Pupils are equal and briskly reactive to light. Funduscopic exam without evidence of pallor or edema. Extraocular movements  in vertical and horizontal planes intact and without nystagmus. Visual fields by finger  perimetry are intact. Hearing to finger rub intact.   Facial sensation intact to fine touch.  Facial motor strength is symmetric and tongue and uvula move midline. Shoulder shrug was symmetrical.   Motor exam:   Normal tone, muscle bulk and symmetric strength in all extremities. Sensory:  Fine touch, pinprick and vibration were tested in all extremities. Proprioception tested in the upper extremities was normal. Coordination: Rapid alternating movements in the fingers/hands was normal. Finger-to-nose maneuver  normal without evidence of ataxia, dysmetria or tremor. Gait and station: Patient walks without assistive device and is able unassisted to climb up to the exam table. Strength within normal limits. Stance is stable and normal.  Deep tendon reflexes: in the upper and lower extremities are symmetric and intact.   Assessment:  After physical and neurologic examination, review of laboratory studies,  Personal review of imaging studies, reports of other /same  Imaging studies, results of polysomnography and / or neurophysiology testing and pre-existing records as far as provided in visit., my assessment is   1) I have a strong suspicion that [name] [name]'s underlying pulmonary disorders ( plural) predisposed her to having low oxygen levels at night which would explain that she feels  extremely fatigued, very sleepy unable to stay asleep uninterrupted.  Nocturia is only one part of her frequent arousals as she often feels that she is short of breath or has a breath-holding spell when she wakes. With her multiple comorbidities she would need an attended sleep study, she needs measures for hypoxemia as well as for the possibility of hypercapnia she has documented reduced lung capacity partially due to sarcoidosis, partially due to her history of pneumothorax.  2)She reports very vivid dreams, these can be related to medication.  She does  have sleep attacks in daytime, she reports that she has lost muscle tone when laughing hard or getting startled.  This will justified to evaluate her further for narcolepsy once she is cleared or treated sleep disordered breathing  3) She is not aware of the reason for the pulmonary embolus either, has not tested positive for any blood clotting disorder to her knowledge.    The patient was advised of the nature of the diagnosed disorder , the treatment options and the  risks for general health and wellness arising from not treating the condition.   I spent more than 50 minutes of face to face time with the patient.  Greater than 50% of time was spent in counseling and coordination of care. We have discussed the diagnosis and differential and I answered the patient's questions.    Plan:  Treatment plan and additional workup :  Attended split-night polysomnography with measures of hypercapnia.  I do not need average CO2 levels I need peak pulse 4 REM and non-REM sleep only, and the total time spent at 50 torr or above If the patient has significant hypoxemia without much associated apnea, please place on oxygen. Split-night after AHI of 20, with 3% desaturation rule.    Larey Seat, MD 85/27/7824, 2:35 AM  Certified in Neurology by ABPN Certified in [place] by [facility] [address], [place], [place] [address]

## 2017-06-11 ENCOUNTER — Ambulatory Visit (INDEPENDENT_AMBULATORY_CARE_PROVIDER_SITE_OTHER): Payer: BLUE CROSS/BLUE SHIELD | Admitting: Neurology

## 2017-06-11 DIAGNOSIS — G4736 Sleep related hypoventilation in conditions classified elsewhere: Secondary | ICD-10-CM

## 2017-06-11 DIAGNOSIS — G4730 Sleep apnea, unspecified: Secondary | ICD-10-CM | POA: Diagnosis not present

## 2017-06-11 DIAGNOSIS — G4710 Hypersomnia, unspecified: Secondary | ICD-10-CM | POA: Diagnosis not present

## 2017-06-11 DIAGNOSIS — D860 Sarcoidosis of lung: Secondary | ICD-10-CM

## 2017-06-11 DIAGNOSIS — I2729 Other secondary pulmonary hypertension: Secondary | ICD-10-CM

## 2017-06-11 DIAGNOSIS — G4701 Insomnia due to medical condition: Secondary | ICD-10-CM

## 2017-06-11 DIAGNOSIS — F518 Other sleep disorders not due to a substance or known physiological condition: Secondary | ICD-10-CM

## 2017-06-11 DIAGNOSIS — I2699 Other pulmonary embolism without acute cor pulmonale: Secondary | ICD-10-CM

## 2017-06-16 ENCOUNTER — Telehealth: Payer: Self-pay | Admitting: Neurology

## 2017-06-16 NOTE — Telephone Encounter (Signed)
I called pt. I advised pt that Dr. Brett Fairy reviewed pt's sleep study and found that had episodes of talking during her REM sleep. Dr. Brett Fairy recommends that pt take melatonin 5 mg or less to help with dream entactment. I advised that no sleep apnea was found. I reviewed sleep hygiene recommendations with the pt, including trying to keep a regular sleep wake schedule, avoiding electronics in the bedroom, keeping the bedroom cool, dark, and quiet, and avoiding eating or exercising within 2 hours of bedtime as well as eating in the middle of the night. I advised pt to keep pets out of the bedroom. I discussed with pt the importance of stress relief and to try meditation, deep breathing exercises, and/or a white noise machine or fan to diffuse other noise distracters. I advised pt to not drink alcohol before bedtime and to never mix alcohol and sedating medications. Pt was advised to avoid narcotic pain medication close to bedtime. Pt verbalized understanding of results. Pt had no questions at this time but was encouraged to call back if questions arise.

## 2017-06-16 NOTE — Procedures (Signed)
PATIENT'S NAME:  Dura, Mccormack DOB:      1965-10-05      MR#:    [id number]     DATE OF RECORDING: 06/11/2017 REFERRING M.D.:  Augusto Gamble, NP Study Performed:   Baseline Polysomnogram HISTORY: 52 year old AAF with sarcoidosis, pneumothorax, pulmonary embolism, sinus surgery.   The patient reports vivid dreams, breath-holding spells, sleeps with the head of bed elevated. She endorsed the Epworth Sleepiness Scale elevated at 16/24 points.   The patient's weight 149 pounds with a height of 65 (inches), resulting in a BMI of 25.1 kg/m2.The patient's neck circumference measured 14 inches.  CURRENT MEDICATIONS: Cetirizine, Cromolyn, Fluticasone and Prednisone.   PROCEDURE:  This is a multichannel digital polysomnogram utilizing the Somnostar 11.2 system.  Electrodes and sensors were applied and monitored per AASM Specifications.   EEG, EOG, Chin and Limb EMG, were sampled at 200 Hz.  ECG, Snore and Nasal Pressure, Thermal Airflow, Respiratory Effort, CPAP Flow and Pressure, Oximetry was sampled at 50 Hz. Digital video and audio were recorded.      BASELINE STUDY Lights Out was at 21:18 and Lights On at 05:05.  Total recording time (TRT) was 468 minutes, with a total sleep time (TST) of only 283 minutes.   The patient's sleep latency was 57.5 minutes.  REM latency was 308 minutes.  The sleep efficiency was 60.5 %.     SLEEP ARCHITECTURE: WASO (Wake after sleep onset) was 154 minutes.  There were 18 minutes in Stage N1, 184.5 minutes Stage N2, 37 minutes Stage N3 and 43.5 minutes in Stage REM.  The percentage of Stage N1 was 6.4%, Stage N2 was 65.2%, Stage N3 was 13.1% and Stage R (REM sleep) was 15.4%.   RESPIRATORY ANALYSIS:  There were a total of 6 respiratory events:  0 apneas and 6 hypopneas with 0 respiratory event related arousals (RERAs). The total APNEA/HYPOPNEA INDEX (AHI) was 1.3/hour and the total RESPIRATORY DISTURBANCE INDEX was 1.3 /hour.  5 events occurred in REM sleep and 2  events in NREM. The REM AHI was 6.9 /hour, versus a non-REM AHI of .3. The patient spent 83.5 minutes of total sleep time in the supine position and 200 minutes in non-supine. The supine AHI was 0.0 versus a non-supine AHI of 1.8.  OXYGEN SATURATION & C02:  The Wake baseline 02 saturation was 97%, with the lowest being 86%. Time spent below 89% saturation equaled 0 minutes. End Tidal CO2 during sleep was 21.7 torr.  During REM, the average End Tidal CO2 was 21.4 torr.  During NREM, the average End Tidal CO2 was 21.8 torr.  Total sleep time greater than 40 torr was 0.40 minutes. The patient did not reach 50 torr.    PERIODIC LIMB MOVEMENTS:  The patient had a total of 0 Periodic Limb Movements.  The arousals were noted as: 45 were spontaneous, 0 were associated with PLMs, and 4 were associated with respiratory events. Audio and video analysis did indeed show dream enacting behaviors, with phonations / vocalizations.   The patient took one bathroom break.  Mild Snoring was noted.EKG was in keeping with normal sinus rhythm (NSR). Post-study, the patient indicated that sleep was the same as usual.    IMPRESSION: 1. 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  2. NO significant apnea. Hypoxemia or hypercapnia. 3. Delayed sleep onset. Sleep hygiene should be reviewed.  RECOMMENDATIONS: Dream enactment can be improved by using Melatonin 5 mg or less at bedtime by  mouth- this can help to suppress dream behavior. I do not recommend using Klonopin in a patient with a pulmonary health history.  1. Consider dedicated sleep psychology referral if insomnia is of clinical concern.   2. A follow up appointment will be scheduled in the Sleep Clinic at [facility]. The referring provider will be notified of the results.      I certify that I have reviewed the entire raw data recording prior to the issuance of this report in accordance with the Standards of  Accreditation of the American Academy of Sleep Medicine (AASM)      Larey Seat, MD    06-15-2016  Diplomat, American Board of Psychiatry and Neurology  Diplomat, American Board of [place] Director, [employer] [facility] [employer]

## 2017-06-16 NOTE — Telephone Encounter (Signed)
-----   Message from Larey Seat, MD sent at 06/16/2017  8:20 AM EST ----- Evidence of vocalization during REM sleep- may be supressed by Melatonin use. No other interventions/  Recommendations. See full note.  CC Augusto Gamble, NP.

## 2018-03-20 ENCOUNTER — Institutional Professional Consult (permissible substitution): Payer: BLUE CROSS/BLUE SHIELD | Admitting: Pulmonary Disease

## 2018-03-20 NOTE — Progress Notes (Deleted)
Synopsis: Referred in October 2019 for sarcoidosis by [facility]  Subjective:   PATIENT ID: [name] [name] GENDER: female DOB: 03/14/1966, MRN: [id number]  No chief complaint on file.   HPI  ***  Past Medical History:  Diagnosis Date  . Cervical cancer ([place])   . GERD (gastroesophageal reflux disease)   . Kidney stones   . Lung collapse 2012  . Pulmonary embolism ([place]) 2013  . Sarcoidosis      Family History  Problem Relation Age of Onset  . Hypertension Mother   . Asthma Mother   . Hypertension Father   . Rheum arthritis Sister   . Heart disease Maternal Grandmother   . Heart attack Paternal Grandmother   . Heart disease Paternal Grandmother   . Diabetes Paternal Grandmother      Past Surgical History:  Procedure Laterality Date  . CYSTOSCOPY    . KIDNEY STONE SURGERY  2014  . LYMPHADENECTOMY  2001  . NASAL SINUS SURGERY  2006    Social History   Socioeconomic History  . Marital status: Single    Spouse name: Not on file  . Number of children: Not on file  . Years of education: Not on file  . Highest education level: Not on file  Occupational History  . Not on file  Social Needs  . Financial resource strain: Not on file  . Food insecurity:    Worry: Not on file    Inability: Not on file  . Transportation needs:    Medical: Not on file    Non-medical: Not on file  Tobacco Use  . Smoking status: Never Smoker  . Smokeless tobacco: Never Used  Substance and Sexual Activity  . Alcohol use: No  . Drug use: No  . Sexual activity: Not on file  Lifestyle  . Physical activity:    Days per week: Not on file    Minutes per session: Not on file  . Stress: Not on file  Relationships  . Social connections:    Talks on phone: Not on file    Gets together: Not on file    Attends religious service: Not on file    Active member of club or organization: Not on file    Attends meetings of clubs or organizations: Not on file   Relationship status: Not on file  . Intimate partner violence:    Fear of current or ex partner: Not on file    Emotionally abused: Not on file    Physically abused: Not on file    Forced sexual activity: Not on file  Other Topics Concern  . Not on file  Social History Narrative  . Not on file     No Known Allergies   Outpatient Medications Prior to Visit  Medication Sig Dispense Refill  . Cetirizine HCl 10 MG CAPS Take 1 capsule daily 30 capsule 12  . cromolyn (OPTICROM) 4 % ophthalmic solution Place 1 drop into both eyes 4 (four) times daily. 10 mL 12  . fluticasone (FLONASE) 50 MCG/ACT nasal spray Place 2 sprays into both nostrils daily. 16 g 3  . predniSONE (DELTASONE) 20 MG tablet Take 20 mg by mouth as needed.     No facility-administered medications prior to visit.     ROS   Objective:  Physical Exam   There were no vitals filed for this visit.   on *** LPM *** RA BMI Readings from Last 3 Encounters:  05/27/17 24.79 kg/m  Wt Readings from Last 3 Encounters:  05/27/17 149 lb (67.6 kg)  01/21/14 130 lb 1.6 oz (59 kg)     CBC    Component Value Date/Time   HGB 11.9 (L) 04/19/2010 0033   HCT 35.0 (L) 04/19/2010 0033    ***  Chest Imaging: ***  Pulmonary Functions Testing Results:  FeNO: ***  Pathology: ***  Echocardiogram: ***  Heart Catheterization: ***    Assessment & Plan:   No diagnosis found.  Discussion: ***   Current Outpatient Medications:  .  Cetirizine HCl 10 MG CAPS, Take 1 capsule daily, Disp: 30 capsule, Rfl: 12 .  cromolyn (OPTICROM) 4 % ophthalmic solution, Place 1 drop into both eyes 4 (four) times daily., Disp: 10 mL, Rfl: 12 .  fluticasone (FLONASE) 50 MCG/ACT nasal spray, Place 2 sprays into both nostrils daily., Disp: 16 g, Rfl: 3 .  predniSONE (DELTASONE) 20 MG tablet, Take 20 mg by mouth as needed., Disp: , Rfl:    Garner Nash, DO [facility] 03/20/2018 9:05 AM

## 2018-03-24 NOTE — Progress Notes (Deleted)
Synopsis: Referred in *** for *** by [facility]  Subjective:   PATIENT ID: [name] [name] GENDER: female DOB: April 29, 1966, MRN: [id number]  No chief complaint on file.   HPI  ***  Past Medical History:  Diagnosis Date  . Cervical cancer ([place])   . GERD (gastroesophageal reflux disease)   . Kidney stones   . Lung collapse 2012  . Pulmonary embolism ([place]) 2013  . Sarcoidosis      Family History  Problem Relation Age of Onset  . Hypertension Mother   . Asthma Mother   . Hypertension Father   . Rheum arthritis Sister   . Heart disease Maternal Grandmother   . Heart attack Paternal Grandmother   . Heart disease Paternal Grandmother   . Diabetes Paternal Grandmother      Past Surgical History:  Procedure Laterality Date  . CYSTOSCOPY    . KIDNEY STONE SURGERY  2014  . LYMPHADENECTOMY  2001  . NASAL SINUS SURGERY  2006    Social History   Socioeconomic History  . Marital status: Single    Spouse name: Not on file  . Number of children: Not on file  . Years of education: Not on file  . Highest education level: Not on file  Occupational History  . Not on file  Social Needs  . Financial resource strain: Not on file  . Food insecurity:    Worry: Not on file    Inability: Not on file  . Transportation needs:    Medical: Not on file    Non-medical: Not on file  Tobacco Use  . Smoking status: Never Smoker  . Smokeless tobacco: Never Used  Substance and Sexual Activity  . Alcohol use: No  . Drug use: No  . Sexual activity: Not on file  Lifestyle  . Physical activity:    Days per week: Not on file    Minutes per session: Not on file  . Stress: Not on file  Relationships  . Social connections:    Talks on phone: Not on file    Gets together: Not on file    Attends religious service: Not on file    Active member of club or organization: Not on file    Attends meetings of clubs or organizations: Not on file    Relationship status: Not on  file  . Intimate partner violence:    Fear of current or ex partner: Not on file    Emotionally abused: Not on file    Physically abused: Not on file    Forced sexual activity: Not on file  Other Topics Concern  . Not on file  Social History Narrative  . Not on file     No Known Allergies   Outpatient Medications Prior to Visit  Medication Sig Dispense Refill  . Cetirizine HCl 10 MG CAPS Take 1 capsule daily 30 capsule 12  . cromolyn (OPTICROM) 4 % ophthalmic solution Place 1 drop into both eyes 4 (four) times daily. 10 mL 12  . fluticasone (FLONASE) 50 MCG/ACT nasal spray Place 2 sprays into both nostrils daily. 16 g 3  . predniSONE (DELTASONE) 20 MG tablet Take 20 mg by mouth as needed.     No facility-administered medications prior to visit.     ROS   Objective:  Physical Exam   There were no vitals filed for this visit.   on *** LPM *** RA BMI Readings from Last 3 Encounters:  05/27/17 24.79 kg/m  Wt Readings from Last 3 Encounters:  05/27/17 149 lb (67.6 kg)  01/21/14 130 lb 1.6 oz (59 kg)     CBC    Component Value Date/Time   HGB 11.9 (L) 04/19/2010 0033   HCT 35.0 (L) 04/19/2010 0033    ***  Chest Imaging: ***  Pulmonary Functions Testing Results:  FeNO: ***  Pathology: ***  Echocardiogram: ***  Heart Catheterization: ***    Assessment & Plan:   No diagnosis found.  Discussion: ***   Current Outpatient Medications:  .  Cetirizine HCl 10 MG CAPS, Take 1 capsule daily, Disp: 30 capsule, Rfl: 12 .  cromolyn (OPTICROM) 4 % ophthalmic solution, Place 1 drop into both eyes 4 (four) times daily., Disp: 10 mL, Rfl: 12 .  fluticasone (FLONASE) 50 MCG/ACT nasal spray, Place 2 sprays into both nostrils daily., Disp: 16 g, Rfl: 3 .  predniSONE (DELTASONE) 20 MG tablet, Take 20 mg by mouth as needed., Disp: , Rfl:    Garner Nash, DO [facility] 03/24/2018 8:10 PM

## 2018-03-25 ENCOUNTER — Institutional Professional Consult (permissible substitution): Payer: BLUE CROSS/BLUE SHIELD | Admitting: Pulmonary Disease

## 2018-04-02 ENCOUNTER — Encounter: Payer: Self-pay | Admitting: Pulmonary Disease

## 2018-04-02 ENCOUNTER — Ambulatory Visit (INDEPENDENT_AMBULATORY_CARE_PROVIDER_SITE_OTHER): Payer: BLUE CROSS/BLUE SHIELD | Admitting: Pulmonary Disease

## 2018-04-02 VITALS — BP 118/82 | HR 60 | Ht 66.0 in | Wt 150.2 lb

## 2018-04-02 DIAGNOSIS — K1120 Sialoadenitis, unspecified: Secondary | ICD-10-CM | POA: Diagnosis not present

## 2018-04-02 DIAGNOSIS — R590 Localized enlarged lymph nodes: Secondary | ICD-10-CM | POA: Diagnosis not present

## 2018-04-02 DIAGNOSIS — R091 Pleurisy: Secondary | ICD-10-CM | POA: Diagnosis not present

## 2018-04-02 DIAGNOSIS — D869 Sarcoidosis, unspecified: Secondary | ICD-10-CM

## 2018-04-02 DIAGNOSIS — D860 Sarcoidosis of lung: Secondary | ICD-10-CM

## 2018-04-02 NOTE — Progress Notes (Signed)
Synopsis: Referred in Oct 2019 for sarcoidosis by [facility]  Subjective:   PATIENT ID: [name] [name] GENDER: female DOB: Mar 31, 1966, MRN: [id number]  Chief Complaint  Patient presents with  . Consult    States she has been having severe headaches everyday until she had an injection.     PMH cervical cancer treatment with chemo and xrt and lymphnode dissection in 2001, in 2012 had a pneumothorax, Pulmonary embolism 2013 off Ronald (given coumadin at the time), felt to be caused after surgery, kidney stones in 2013 & 2018. Diagnosed with Sarcoidosis in 2009, with a lip biopsy, had neg lung and mediastinal biopsies, and was also found in her eye. Followed at [facility] for the past 10 years.   She was treated with prednisone for more an a year for the first year. She had a flare in 2012 and 2014 she was treated with prednisone. Over the past year has been doing well. She had a headache a few months ago that wouldn't go away. She has seen an ophthalmologist recently. 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 No sarcoid eye disease.    Past Medical History:  Diagnosis Date  . Cervical cancer ([place])   . GERD (gastroesophageal reflux disease)   . Kidney stones   . Lung collapse 2012  . Pulmonary embolism ([place]) 2013  . Sarcoidosis      Family History  Problem Relation Age of Onset  . Hypertension Mother   . Asthma Mother   . Hypertension Father   . Rheum arthritis Sister   . Heart disease Maternal Grandmother   . Heart attack Paternal Grandmother   . Heart disease Paternal Grandmother   . Diabetes Paternal Grandmother      Past Surgical History:  Procedure Laterality Date  . CYSTOSCOPY    . KIDNEY STONE SURGERY  2014  . LYMPHADENECTOMY  2001  . NASAL SINUS SURGERY  2006    Social History   Socioeconomic History  . Marital status: Single    Spouse name: Not on file  . Number of children: Not on file  . Years of education: Not on file  . Highest education level: Not  on file  Occupational History  . Not on file  Social Needs  . Financial resource strain: Not on file  . Food insecurity:    Worry: Not on file    Inability: Not on file  . Transportation needs:    Medical: Not on file    Non-medical: Not on file  Tobacco Use  . Smoking status: Never Smoker  . Smokeless tobacco: Never Used  Substance and Sexual Activity  . Alcohol use: No  . Drug use: No  . Sexual activity: Not on file  Lifestyle  . Physical activity:    Days per week: Not on file    Minutes per session: Not on file  . Stress: Not on file  Relationships  . Social connections:    Talks on phone: Not on file    Gets together: Not on file    Attends religious service: Not on file    Active member of club or organization: Not on file    Attends meetings of clubs or organizations: Not on file    Relationship status: Not on file  . Intimate partner violence:    Fear of current or ex partner: Not on file    Emotionally abused: Not on file    Physically abused: Not on file    Forced  sexual activity: Not on file  Other Topics Concern  . Not on file  Social History Narrative  . Not on file     No Known Allergies   Outpatient Medications Prior to Visit  Medication Sig Dispense Refill  . Cetirizine HCl 10 MG CAPS Take 1 capsule daily (Patient not taking: Reported on 04/02/2018) 30 capsule 12  . cromolyn (OPTICROM) 4 % ophthalmic solution Place 1 drop into both eyes 4 (four) times daily. (Patient not taking: Reported on 04/02/2018) 10 mL 12  . fluticasone (FLONASE) 50 MCG/ACT nasal spray Place 2 sprays into both nostrils daily. (Patient not taking: Reported on 04/02/2018) 16 g 3  . predniSONE (DELTASONE) 20 MG tablet Take 20 mg by mouth as needed.     No facility-administered medications prior to visit.     Review of Systems  Constitutional: Negative for chills, fever, malaise/fatigue and weight loss.  HENT: Negative for hearing loss, sore throat and tinnitus.   Eyes:  Negative for blurred vision and double vision.  Respiratory: Negative for cough, hemoptysis, sputum production, shortness of breath, wheezing and stridor.   Cardiovascular: Negative for chest pain, palpitations, orthopnea, leg swelling and PND.  Gastrointestinal: Negative for abdominal pain, constipation, diarrhea, heartburn, nausea and vomiting.  Genitourinary: Negative for dysuria, hematuria and urgency.  Musculoskeletal: Negative for joint pain and myalgias.  Skin: Negative for itching and rash.  Neurological: Negative for dizziness, tingling, weakness and headaches.  Endo/Heme/Allergies: Negative for environmental allergies. Does not bruise/bleed easily.  Psychiatric/Behavioral: Negative for depression. The patient is not nervous/anxious and does not have insomnia.   All other systems reviewed and are negative.    Objective:  Physical Exam  Constitutional: She is oriented to person, place, and time. She appears well-developed and well-nourished. No distress.  HENT:  Head: Normocephalic and atraumatic.  Mouth/Throat: Oropharynx is clear and moist.  Eyes: Pupils are equal, round, and reactive to light. Conjunctivae are normal. No scleral icterus.  Neck: Neck supple. No JVD present. No tracheal deviation present.  Cardiovascular: Normal rate, regular rhythm, normal heart sounds and intact distal pulses.  No murmur heard. Pulmonary/Chest: Effort normal and breath sounds normal. No accessory muscle usage or stridor. No tachypnea. No respiratory distress. She has no wheezes. She has no rhonchi. She has no rales.  Abdominal: Soft. Bowel sounds are normal. She exhibits no distension. There is no tenderness.  Musculoskeletal: She exhibits no edema or tenderness.  Lymphadenopathy:    She has no cervical adenopathy.  Neurological: She is alert and oriented to person, place, and time.  Skin: Skin is warm and dry. Capillary refill takes less than 2 seconds. No rash noted.  Psychiatric: She has  a normal mood and affect. Her behavior is normal.  Vitals reviewed.    Vitals:   04/02/18 1602  BP: 118/82  Pulse: 60  SpO2: 100%  Weight: 150 lb 3.2 oz (68.1 kg)  Height: 5\' 6"  (1.676 m)   100% on RA BMI Readings from Last 3 Encounters:  04/02/18 24.24 kg/m  05/27/17 24.79 kg/m   Wt Readings from Last 3 Encounters:  04/02/18 150 lb 3.2 oz (68.1 kg)  05/27/17 149 lb (67.6 kg)  01/21/14 130 lb 1.6 oz (59 kg)     CBC    Component Value Date/Time   HGB 11.9 (L) 04/19/2010 0033   HCT 35.0 (L) 04/19/2010 0033     Chest Imaging: No recent chest imaging for review within the [facility].  CT chest 02/01/2007: Impression with bulky  mediastinal and hilar adenopathy.  PET/CT 2012: Extensive metastatic hypermetabolic lymph nodes within the neck, chest, and abdomen pelvis as well as peritoneal implants.  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  She was treated with prednisone for a period of time that was subsequently tapered off.  She had a flare in 2012 of this disease again.  She had separate biopsies completed confirming active sarcoidosis.   At this time she has been doing very well and she is here to establish care.  At this time she has no current respiratory symptoms.  Upon review of the record it has  been several years since she has had chest imaging as well as PFTs imaging.  It would likely be a good idea to obtain the following prior to next office visit.  Noncontrasted CT of the chest Full pulmonary function test as well as a 6-minute walk We can call her with th results  Return to clinic in 6 months or if symptoms worsen.  Garner Nash, DO [facility] 04/02/2018 5:36 PM

## 2018-04-02 NOTE — Patient Instructions (Addendum)
Thank you for visiting Dr. Valeta Harms at [facility]. Today we recommend the following:  Return in about 1 year (around 04/03/2019), or if symptoms worsen or fail to improve.  We are moving our office in November. The new address will be: [address] [employer] [address] Phone: [phone]

## 2018-04-10 ENCOUNTER — Telehealth: Payer: Self-pay

## 2018-04-10 NOTE — Telephone Encounter (Signed)
Called and spoke to patient, made aware that we will be ordering PFT and Chest CT for continuity of care as she is a new patient. Patient voices understanding. She did ask that we call her next week to get the PFT scheduled as she was busy. Reminder put in Epic to call patient next week to schedule PFT.  Nothing further is needed at this time.

## 2018-12-01 ENCOUNTER — Telehealth: Payer: Self-pay | Admitting: *Deleted

## 2018-12-01 NOTE — Telephone Encounter (Signed)

## 2018-12-02 ENCOUNTER — Other Ambulatory Visit: Payer: Self-pay

## 2018-12-02 ENCOUNTER — Ambulatory Visit (INDEPENDENT_AMBULATORY_CARE_PROVIDER_SITE_OTHER)
Admission: RE | Admit: 2018-12-02 | Discharge: 2018-12-02 | Disposition: A | Discharge status: Home / Self Care | Payer: BC Managed Care – PPO | Source: Ambulatory Visit | Attending: Pulmonary Disease | Admitting: Pulmonary Disease

## 2018-12-02 DIAGNOSIS — D869 Sarcoidosis, unspecified: Secondary | ICD-10-CM

## 2018-12-29 ENCOUNTER — Encounter: Payer: Self-pay | Admitting: *Deleted

## 2019-01-04 ENCOUNTER — Telehealth: Payer: Self-pay | Admitting: Pulmonary Disease

## 2019-01-04 NOTE — Telephone Encounter (Signed)
Notes recorded by Garner Nash, DO on 12/16/2018 at 5:00 PM EDT  [place],  Charles can let her know that I have reviewed her images. Consistent with sarcoid. We will need to continue to follow as well as PFTs for any worsening progression. She should let us know if she is having any breathing problems.  [facility]  12/16/2018 5:00 PM    Called and spoke with pt letting her know the results of the CT. Pt verbalized understanding. Nothing further needed.

## 2021-05-23 IMAGING — CT CT CHEST WITHOUT CONTRAST
2 of 3 series · 15 of 36 positions shown, 18 images · non-contrast
Comparison: None.

CLINICAL DATA: Follow-up sarcoidosis, biopsy-proven disease,
pleural in volume, adenopathy

EXAM:
CT CHEST WITHOUT CONTRAST
TECHNIQUE: Multidetector CT imaging of the chest was performed following the
standard protocol without IV contrast.

[Series 2: thorax · axial · 0.65mm/px · z∈[-271,-11]mm · 12 of 154 slices shown, 15 images]
[im 12/154  mediastinal]
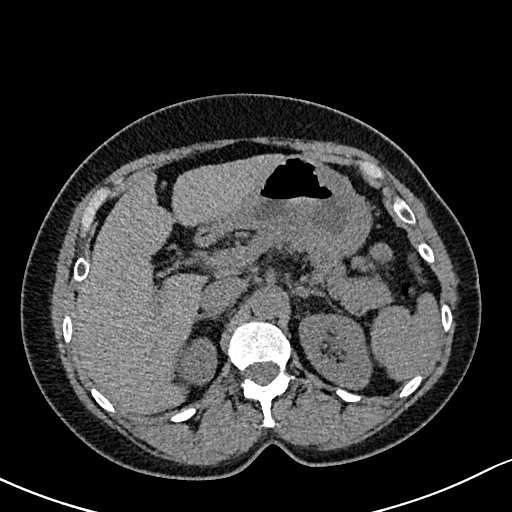
[im 12/154  lung]
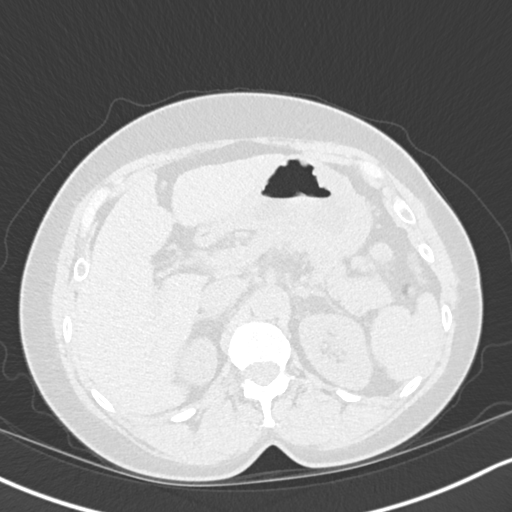
[im 23/154  lung]
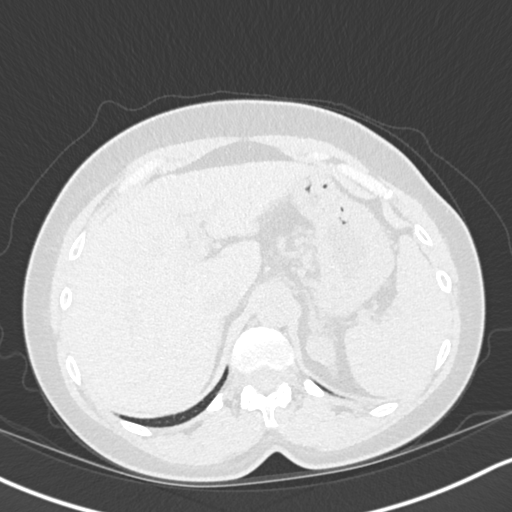
[im 35/154  lung]
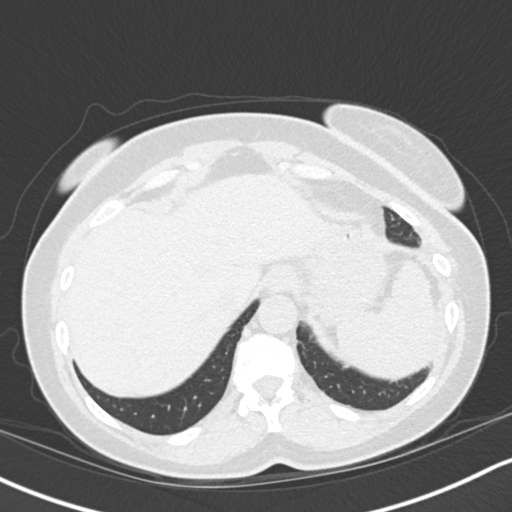
[im 46/154  lung]
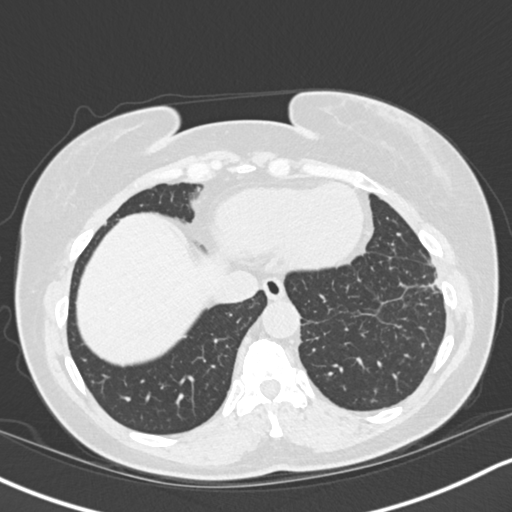
[im 57/154  mediastinal]
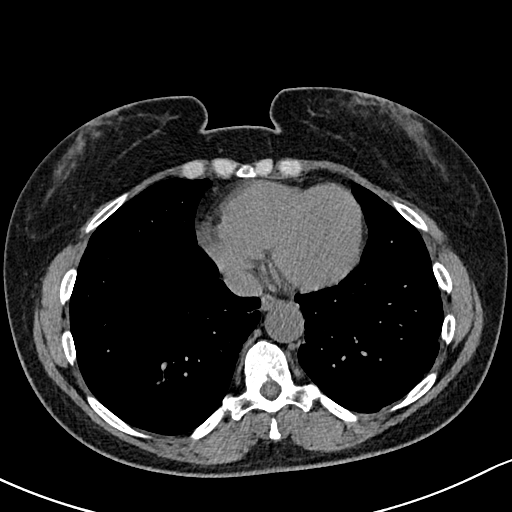
[im 57/154  lung]
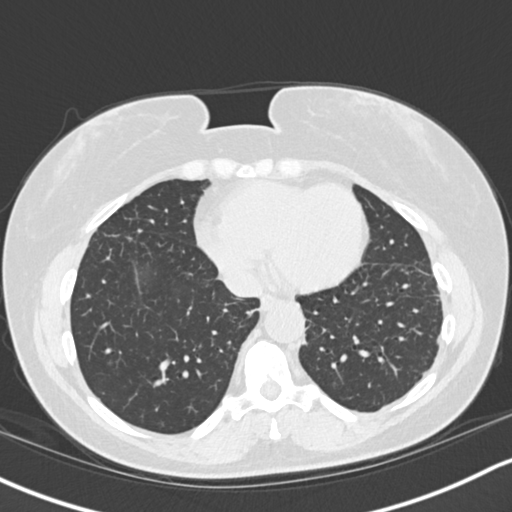
[im 69/154  lung]
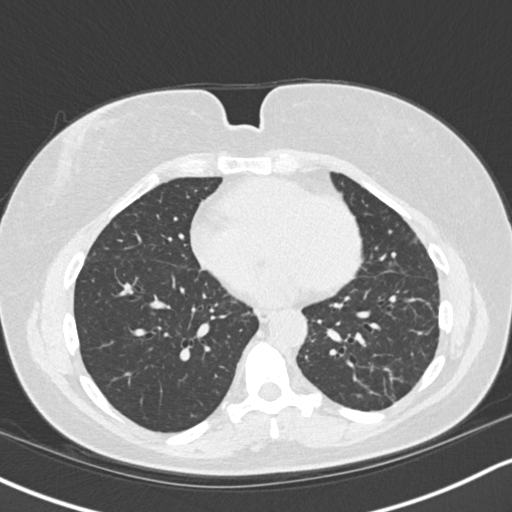
[im 86/154  lung]
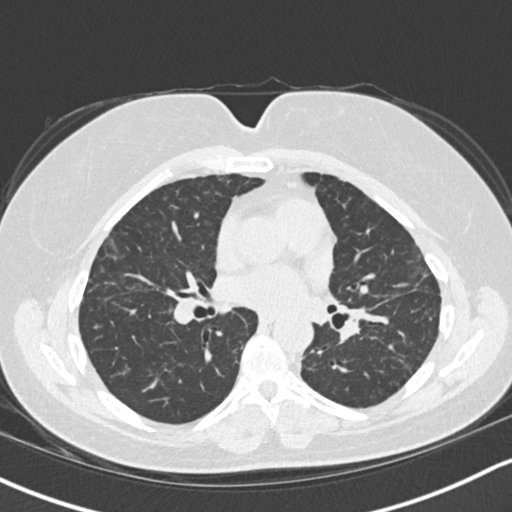
[im 97/154  lung]
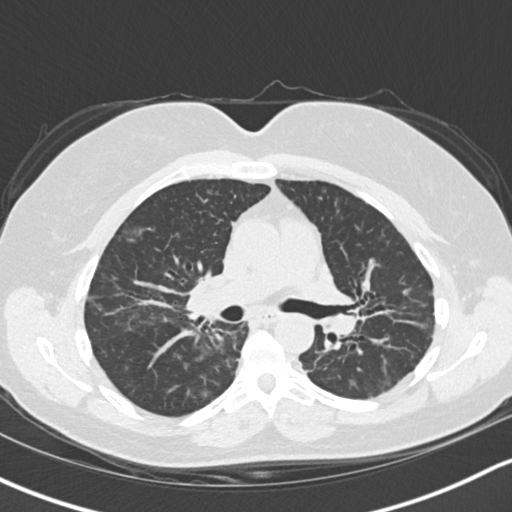
[im 108/154  mediastinal]
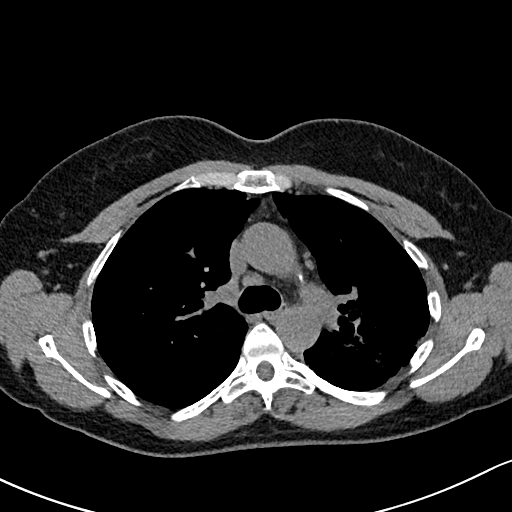
[im 108/154  lung]
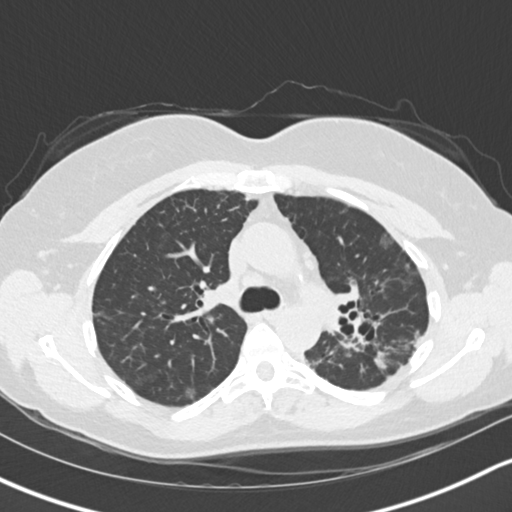
[im 120/154  lung]
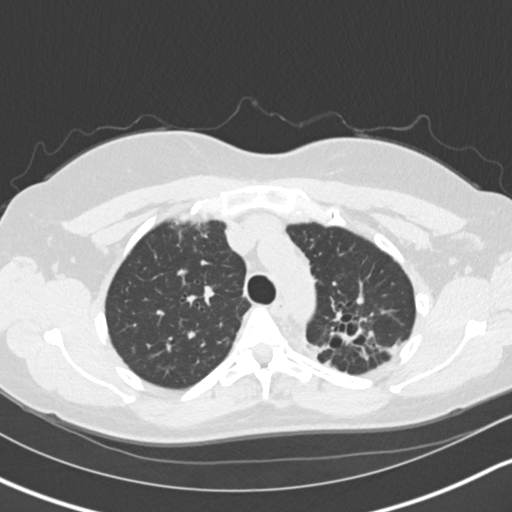
[im 131/154  lung]
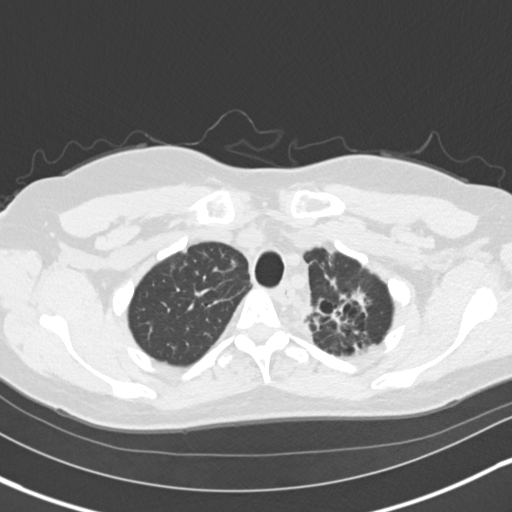
[im 142/154  lung]
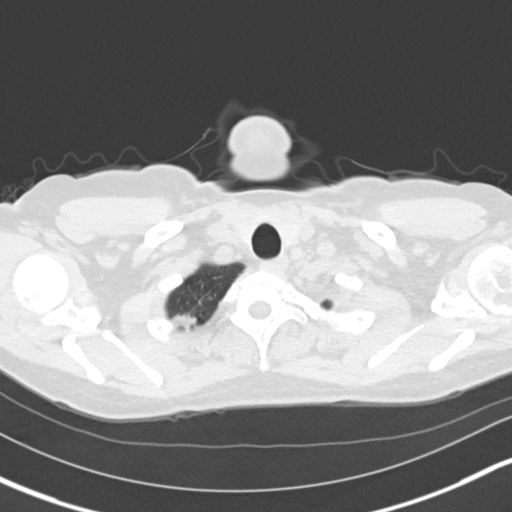

[Series 5: coronal · coronal · 0.60mm/px · 3 of 110 slices shown]
[im 22/110  lung]
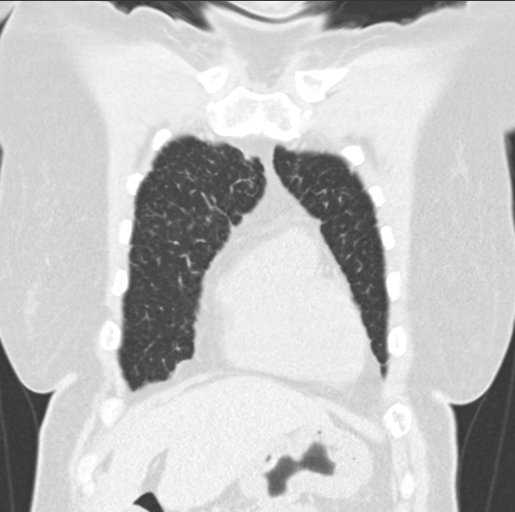
[im 44/110  lung]
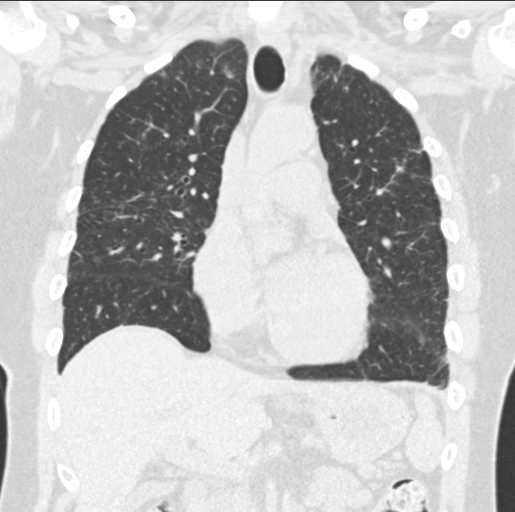
[im 66/110  lung]
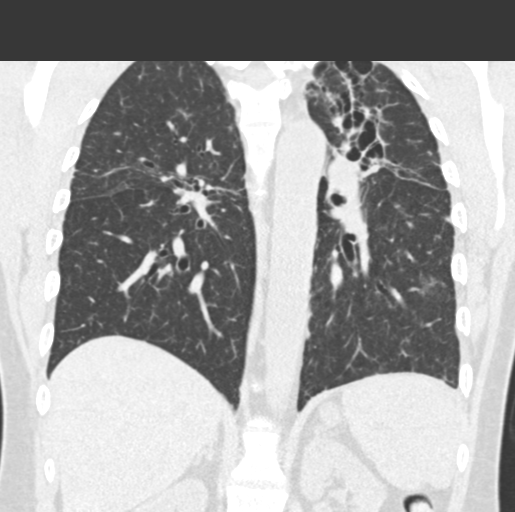

[15 of 36 positions shown; findings below may reference images not displayed]

FINDINGS: Cardiovascular: No significant vascular findings. Normal heart size.
Left coronary artery calcification. No pericardial effusion.

Mediastinum/Nodes: There are prominent although not pathologically
enlarged mediastinal and hilar lymph nodes. Thyroid gland, trachea,
and esophagus demonstrate no significant findings.

Lungs/Pleura: There is fine, generally peribronchovascular pulmonary
nodularity and ground-glass opacity, with generally mild findings of
pulmonary fibrosis, with bronchiectasis and volume loss most
conspicuous in the left upper lobe (series 3, image 45) and fine
irregular subpleural interstitial opacity elsewhere, for example in
the left lung base (series 3, image 91). No pleural effusion or
pneumothorax.

Upper Abdomen: No acute abnormality. Small nonobstructive superior
pole calculus of the right kidney.

Musculoskeletal: No chest wall mass or suspicious bone lesions
identified.
IMPRESSION: 1. There is fine, generally peribronchovascular pulmonary nodularity
and ground-glass opacity, with generally mild findings of pulmonary
fibrosis, with bronchiectasis and volume loss most conspicuous in
the left upper lobe (series 3, image 45) and fine irregular
subpleural interstitial opacity elsewhere, for example in the left
lung base (series 3, image 91). This pattern of fibrosis is
generally in keeping with reported diagnosis of pulmonary
sarcoidosis. Comparison to prior imaging would be helpful to
evaluate for progression, or alternately ongoing annual follow-up.

2.  Coronary artery disease.

3.  Nonobstructive right nephrolithiasis.
